# Patient Record
Sex: Female | Born: 1985 | Race: White | Hispanic: No | Marital: Single
Health system: Southern US, Community
[De-identification: ages and names within clinical notes are randomized; demographics above are authoritative.]

---

## 2016-10-09 ENCOUNTER — Emergency Department (HOSPITAL_COMMUNITY)
Admission: EM | Admit: 2016-10-09 | Discharge: 2016-10-09 | Disposition: A | Discharge status: Home / Self Care | Payer: Self-pay | Attending: Emergency Medicine | Admitting: Emergency Medicine

## 2016-10-09 DIAGNOSIS — F1910 Other psychoactive substance abuse, uncomplicated: Secondary | ICD-10-CM

## 2016-10-09 DIAGNOSIS — F1110 Opioid abuse, uncomplicated: Secondary | ICD-10-CM | POA: Insufficient documentation

## 2016-10-09 NOTE — ED Provider Notes (Signed)
[facility]-EM[facility] DEPT Provider Note   CSN: [id number] Arrival date & time: 10/09/16  0001  By signing my name below, I, Alyssa GroveMartin Green, attest that this documentation has been prepared under the direction and in the presence of Shon Batonourtney F Horton, MD. Electronically Signed: Alyssa GroveMartin Green, ED Scribe. 10/09/16. 1:01 AM.   History   Chief Complaint Chief Complaint  Patient presents with  . Psychiatric Evaluation   The history is provided by the patient. No language interpreter was used.   HPI Comments: Melinda Velez is a 30 y.o. female who presents to the Emergency Department presenting for medical clearance. Pt states she has been speaking with the officer and has changed her mind about detox. Denies drug use to me But endorsed multiple drugs to the triage nurse. She notes she slept at a friend's house last night. Denies hx of psychiatric disorder. No HI, SI, hallucinations.  Police officer at the bedside. Reports that he got called to the hotel. Reports that the patient wanted to speak to someone regarding "events." She was very vague. She endorsed wanting detox and was brought here. She is not under arrest.  No past medical history on file.  There are no active problems to display for this patient.   No past surgical history on file.  OB History    No data available      Home Medications    Prior to Admission medications   Not on File    Family History No family history on file.  Social History Social History  Substance Use Topics  . Smoking status: Not on file  . Smokeless tobacco: Not on file  . Alcohol use Not on file     Allergies   Review of patient's allergies indicates not on file.   Review of Systems Review of Systems  Constitutional: Negative for fever.  Respiratory: Negative for shortness of breath.   Cardiovascular: Negative for chest pain.  Psychiatric/Behavioral: Negative for hallucinations and suicidal ideas.       - Homicidal ideations  All  other systems reviewed and are negative.    Physical Exam Updated Vital Signs BP 111/87 (BP Location: Left Arm)   Pulse 72   Resp 20   Ht 5\' 6"  (1.676 m)   Wt 110 lb (49.9 kg)   SpO2 99%   BMI 17.75 kg/m   Physical Exam  Constitutional: She is oriented to person, place, and time. No distress.  Paranoid and anxious appearing, no acute distress  HENT:  Head: Normocephalic and atraumatic.  Cardiovascular: Normal rate and regular rhythm.   Pulmonary/Chest: Effort normal. No respiratory distress.  Neurological: She is alert and oriented to person, place, and time.  Skin: Skin is warm and dry.  Psychiatric:  Paranoid, does not appear to be responding to internal stimuli  Nursing note and vitals reviewed.    ED Treatments / Results  DIAGNOSTIC STUDIES: Oxygen Saturation is 99% on RA, normal by my interpretation.    Labs (all labs ordered are listed, but only abnormal results are displayed) Labs Reviewed - No data to display  EKG  EKG Interpretation None       Radiology No results found.  Procedures Procedures (including critical care time)  Medications Ordered in ED Medications - No data to display   Initial Impression / Assessment and Plan / ED Course  I have reviewed the triage vital signs and the nursing notes.  Pertinent labs & imaging results that were available during my care of the patient  were reviewed by me and considered in my medical decision making (see chart for details).  Clinical Course   Patient initially thought improper police as she was requesting detox. She currently denies drug use to me" has changed her mind." She appears paranoid and interesting. However, she is awake, alert, oriented 3. She denies SI or HI. Given that she is requesting discharge, do not feel she is a harm to herself at this time. She will be discharged at her own request.  I personally performed the services described in this documentation, which was scribed in my  presence. The recorded information has been reviewed and is accurate.   Final Clinical Impressions(s) / ED Diagnoses   Final diagnoses:  Substance abuse    New Prescriptions New Prescriptions   No medications on file     Shon Baton, MD 10/09/16 [phone]

## 2016-10-09 NOTE — ED Notes (Signed)
Bed: [facility] Expected date:  Expected time:  Means of arrival:  Comments: 

## 2016-10-09 NOTE — ED Triage Notes (Signed)
Patient was transported by [employer] but she is not in custody. Pt is wanting detox drugs, "everything", crack, meth, "pills", and morphine. Patient seems paranoid that she is going to get in trouble and police reported she wanted a safe place to go to. Pt reports she has had rehab at [facility] [place].

## 2016-10-09 NOTE — ED Notes (Signed)
Patient refused to complete triage questions. Dr. Wilkie AyeHorton came to bedside and spoke with patient. Patient is not admitting to taking any drugs or ETOH. Also, patient refused she was SI or HI.

## 2016-10-16 ENCOUNTER — Encounter (HOSPITAL_COMMUNITY): Payer: Self-pay

## 2016-10-16 ENCOUNTER — Emergency Department (HOSPITAL_COMMUNITY)
Admission: EM | Admit: 2016-10-16 | Discharge: 2016-10-16 | Disposition: A | Discharge status: Home / Self Care | Attending: Emergency Medicine | Admitting: Emergency Medicine

## 2016-10-16 ENCOUNTER — Emergency Department (HOSPITAL_COMMUNITY)

## 2016-10-16 DIAGNOSIS — S0101XA Laceration without foreign body of scalp, initial encounter: Secondary | ICD-10-CM | POA: Diagnosis present

## 2016-10-16 DIAGNOSIS — Y9302 Activity, running: Secondary | ICD-10-CM | POA: Diagnosis not present

## 2016-10-16 DIAGNOSIS — F17200 Nicotine dependence, unspecified, uncomplicated: Secondary | ICD-10-CM | POA: Insufficient documentation

## 2016-10-16 DIAGNOSIS — Y999 Unspecified external cause status: Secondary | ICD-10-CM | POA: Insufficient documentation

## 2016-10-16 DIAGNOSIS — W01198A Fall on same level from slipping, tripping and stumbling with subsequent striking against other object, initial encounter: Secondary | ICD-10-CM | POA: Insufficient documentation

## 2016-10-16 DIAGNOSIS — Y92149 Unspecified place in prison as the place of occurrence of the external cause: Secondary | ICD-10-CM | POA: Insufficient documentation

## 2016-10-16 DIAGNOSIS — Z23 Encounter for immunization: Secondary | ICD-10-CM | POA: Diagnosis not present

## 2016-10-16 MED ORDER — LIDOCAINE-EPINEPHRINE-TETRACAINE (LET) SOLUTION
3.0000 mL | Freq: Once | NASAL | Status: AC
  Administered 2016-10-16: 3 mL via TOPICAL
  Filled 2016-10-16: qty 3

## 2016-10-16 MED ORDER — TETANUS-DIPHTH-ACELL PERTUSSIS 5-2.5-18.5 LF-MCG/0.5 IM SUSP
0.5000 mL | Freq: Once | INTRAMUSCULAR | Status: AC
  Administered 2016-10-16: 0.5 mL via INTRAMUSCULAR
  Filled 2016-10-16: qty 0.5

## 2016-10-16 NOTE — ED Notes (Signed)
PA at bedside.

## 2016-10-16 NOTE — Discharge Instructions (Signed)
Have staples removed in one week Clean wound with soap and water

## 2016-10-16 NOTE — ED Triage Notes (Signed)
[facility]. Patient states she was mopping and someone left her a key to escape and states she let herself out of the cell and started running and slipped on the wet floor and fell back and hit the right parietal area of her head and right elbow-bleeding is now controlled. No LOC. Patient ambulatory on arrival.

## 2016-10-16 NOTE — ED Notes (Signed)
Head wrapped with gauze and coband per PA's orders

## 2016-10-16 NOTE — ED Provider Notes (Signed)
[facility] Provider Note   CSN: [id number] Arrival date & time: 10/16/16  2104  By signing my name below, I, Melinda Velez, attest that this documentation has been prepared under the direction and in the presence of Melinda HartKelly Conor Lata, PA-C. Electronically Signed: Linna Darnerussell Velez, Scribe. 10/16/2016. 10:00 PM.  History   Chief Complaint Chief Complaint  Patient presents with  . Fall  . Head Laceration    The history is provided by the patient. No language interpreter was used.     HPI Comments: Melinda Velez is a 30 y.o. female brought in by police who presents to the Emergency Department complaining of laceration to the right occipital area of her head s/p slipping and falling shortly PTA. Patient is a unreliable historian and it is unclear if the history is accurate. Pt is incarcerated and states she was trying to escape from jail. She states she was running, slipped on a wet floor, and hit the back of her head on the floor. She denies losing consciousness. She reports she had bleeding from her head which is now controlled. Pt states she also struck her right elbow on the floor and notes some right elbow pain and associated bruising. She states she thinks it's broken. Pt denies vision changes, left elbow pain, numbness, weakness, nausea, vomiting, or any other associated symptoms.  The officer with the patient states that the incident was unwitnessed but reports patient doesn't want to be in jail so she will intentionally injure herself.  History reviewed. No pertinent past medical history.  There are no active problems to display for this patient.   History reviewed. No pertinent surgical history.  OB History    No data available       Home Medications    Prior to Admission medications   Not on File    Family History No family history on file.  Social History Social History  Substance Use Topics  . Smoking status: Current Every Day Smoker  . Smokeless  tobacco: Never Used  . Alcohol use No     Allergies   Review of patient's allergies indicates no known allergies.   Review of Systems Review of Systems  Gastrointestinal: Negative for nausea and vomiting.  Musculoskeletal: Positive for arthralgias (right elbow).  Skin: Positive for color change (bruising to right elbow) and wound (head laceration).  Neurological: Negative for syncope, weakness and numbness.  All other systems reviewed and are negative.   Physical Exam Updated Vital Signs BP 115/70   Pulse 85   Temp 98.3 F (36.8 C)   Resp 20   SpO2 100%   Physical Exam  Constitutional: She is oriented to person, place, and time. She appears well-developed and well-nourished. No distress.  In handcuffs  HENT:  Head: Normocephalic.  3 cm posterior scalp laceration. Bleeding controlled  Eyes: Conjunctivae and EOM are normal.  Neck: Neck supple. No tracheal deviation present.  Cardiovascular: Normal rate.   Pulmonary/Chest: Effort normal. No respiratory distress.  Musculoskeletal: Normal range of motion.  Right elbow: No obvious swelling or deformity. Mild tenderness to palpation. ROM deferred (patient in hand cuffs). N/V intact.   Neurological: She is alert and oriented to person, place, and time.  Skin: Skin is warm and dry.  Psychiatric: She has a normal mood and affect. Her speech is normal. Thought content is delusional. Cognition and memory are impaired. She expresses impulsivity. She exhibits abnormal recent memory.  Patient states someone gave her a key to escape jail. Also stating  that 15 people have escaped and there are only 2 other people now in the jail  Nursing note and vitals reviewed.   ED Treatments / Results  Labs (all labs ordered are listed, but only abnormal results are displayed) Labs Reviewed - No data to display  EKG  EKG Interpretation None       Radiology No results found.  Procedures Procedures (including critical care  time)  LACERATION REPAIR Performed by: Melinda Velez Authorized by: Melinda Velez Consent: Verbal consent obtained. Risks and benefits: risks, benefits and alternatives were discussed Consent given by: patient Patient identity confirmed: provided demographic data Prepped and Draped in normal sterile fashion Wound explored  Laceration Location: posterior scalp  Laceration Length: 4 cm  No Foreign Bodies seen or palpated  Anesthesia: local anesthesia  Local anesthetic: LET  Anesthetic total: 3mL  Irrigation method: syringe Amount of cleaning: standard  Skin closure: Staples  Number of staples: 2  Patient tolerance: Patient tolerated the procedure well with no immediate complications.   DIAGNOSTIC STUDIES: Oxygen Saturation is 100% on RA, normal by my interpretation.    COORDINATION OF CARE: 10:10 PM Discussed treatment plan with pt at bedside and pt agreed to plan.  Medications Ordered in ED Medications  lidocaine-EPINEPHrine-tetracaine (LET) solution (3 mLs Topical Given 10/16/16 2126)  Tdap (BOOSTRIX) injection 0.5 mL (0.5 mLs Intramuscular Given 10/16/16 2126)     Initial Impression / Assessment and Plan / ED Course  I have reviewed the triage vital signs and the nursing notes.  Pertinent labs & imaging results that were available during my care of the patient were reviewed by me and considered in my medical decision making (see chart for details).  Clinical Course   30 year old female who presents with a head laceration after a fall and R elbow pain. 2 staples placed. Tdap updated.  Patient is delusional and paranoid but calm and cooperative. R elbow xray is negative. Will d/c back to jail.  I personally performed the services described in this documentation, which was scribed in my presence. The recorded information has been reviewed and is accurate.   Final Clinical Impressions(s) / ED Diagnoses   Final diagnoses:  Laceration of scalp without  foreign body, initial encounter    New Prescriptions New Prescriptions   No medications on file     Melinda Born, PA-C 10/17/16 1451    Melinda Core, MD 10/17/16 [phone]

## 2016-10-17 ENCOUNTER — Emergency Department (HOSPITAL_COMMUNITY)
Admission: EM | Admit: 2016-10-17 | Discharge: 2016-10-18 | Disposition: A | Discharge status: Home / Self Care | Attending: Emergency Medicine | Admitting: Emergency Medicine

## 2016-10-17 ENCOUNTER — Emergency Department (HOSPITAL_COMMUNITY)
Admission: EM | Admit: 2016-10-17 | Discharge: 2016-10-17 | Disposition: A | Discharge status: Home / Self Care | Source: Home / Self Care | Attending: Emergency Medicine | Admitting: Emergency Medicine

## 2016-10-17 ENCOUNTER — Encounter (HOSPITAL_COMMUNITY): Payer: Self-pay

## 2016-10-17 DIAGNOSIS — IMO0002 Reserved for concepts with insufficient information to code with codable children: Secondary | ICD-10-CM

## 2016-10-17 DIAGNOSIS — Y999 Unspecified external cause status: Secondary | ICD-10-CM

## 2016-10-17 DIAGNOSIS — Y939 Activity, unspecified: Secondary | ICD-10-CM | POA: Insufficient documentation

## 2016-10-17 DIAGNOSIS — F17200 Nicotine dependence, unspecified, uncomplicated: Secondary | ICD-10-CM | POA: Insufficient documentation

## 2016-10-17 DIAGNOSIS — S0990XA Unspecified injury of head, initial encounter: Secondary | ICD-10-CM

## 2016-10-17 DIAGNOSIS — X788XXA Intentional self-harm by other sharp object, initial encounter: Secondary | ICD-10-CM | POA: Insufficient documentation

## 2016-10-17 DIAGNOSIS — F6089 Other specific personality disorders: Secondary | ICD-10-CM | POA: Diagnosis not present

## 2016-10-17 DIAGNOSIS — F17210 Nicotine dependence, cigarettes, uncomplicated: Secondary | ICD-10-CM | POA: Diagnosis not present

## 2016-10-17 DIAGNOSIS — Z79899 Other long term (current) drug therapy: Secondary | ICD-10-CM | POA: Insufficient documentation

## 2016-10-17 DIAGNOSIS — Y92149 Unspecified place in prison as the place of occurrence of the external cause: Secondary | ICD-10-CM | POA: Insufficient documentation

## 2016-10-17 DIAGNOSIS — S0181XA Laceration without foreign body of other part of head, initial encounter: Secondary | ICD-10-CM

## 2016-10-17 DIAGNOSIS — F23 Brief psychotic disorder: Secondary | ICD-10-CM | POA: Diagnosis not present

## 2016-10-17 DIAGNOSIS — R4689 Other symptoms and signs involving appearance and behavior: Secondary | ICD-10-CM

## 2016-10-17 DIAGNOSIS — Z046 Encounter for general psychiatric examination, requested by authority: Secondary | ICD-10-CM | POA: Diagnosis present

## 2016-10-17 DIAGNOSIS — Z7289 Other problems related to lifestyle: Secondary | ICD-10-CM

## 2016-10-17 LAB — RAPID URINE DRUG SCREEN, HOSP PERFORMED
AMPHETAMINES: NOT DETECTED
BARBITURATES: NOT DETECTED
Benzodiazepines: NOT DETECTED
COCAINE: NOT DETECTED
OPIATES: NOT DETECTED
Tetrahydrocannabinol: NOT DETECTED

## 2016-10-17 LAB — COMPREHENSIVE METABOLIC PANEL
ALT: 13 U/L — ABNORMAL LOW (ref 14–54)
AST: 18 U/L (ref 15–41)
Albumin: 3.9 g/dL (ref 3.5–5.0)
Alkaline Phosphatase: 60 U/L (ref 38–126)
Anion gap: 8 (ref 5–15)
BILIRUBIN TOTAL: 1 mg/dL (ref 0.3–1.2)
CO2: 26 mmol/L (ref 22–32)
CREATININE: 0.68 mg/dL (ref 0.44–1.00)
Calcium: 8.9 mg/dL (ref 8.9–10.3)
Chloride: 101 mmol/L (ref 101–111)
Glucose, Bld: 86 mg/dL (ref 65–99)
POTASSIUM: 3.4 mmol/L — AB (ref 3.5–5.1)
Sodium: 135 mmol/L (ref 135–145)
TOTAL PROTEIN: 6.8 g/dL (ref 6.5–8.1)

## 2016-10-17 LAB — ETHANOL: Alcohol, Ethyl (B): <5 mg/dL (ref <5)

## 2016-10-17 LAB — CBC WITH DIFFERENTIAL/PLATELET
BASOS PCT: 0 %
Basophils Absolute: 0 10*3/uL (ref 0.0–0.1)
Eosinophils Absolute: 0.1 10*3/uL (ref 0.0–0.7)
Eosinophils Relative: 1 %
HEMATOCRIT: 36.8 % (ref 36.0–46.0)
Hemoglobin: 12.2 g/dL (ref 12.0–15.0)
LYMPHS ABS: 1.6 10*3/uL (ref 0.7–4.0)
Lymphocytes Relative: 20 %
MCH: 30.4 pg (ref 26.0–34.0)
MCHC: 33.2 g/dL (ref 30.0–36.0)
MCV: 91.8 fL (ref 78.0–100.0)
MONOS PCT: 10 %
Monocytes Absolute: 0.8 10*3/uL (ref 0.1–1.0)
NEUTROS ABS: 5.6 10*3/uL (ref 1.7–7.7)
NEUTROS PCT: 69 %
Platelets: 191 10*3/uL (ref 150–400)
RBC: 4.01 MIL/uL (ref 3.87–5.11)
RDW: 15.2 % (ref 11.5–15.5)
WBC: 8.1 10*3/uL (ref 4.0–10.5)

## 2016-10-17 LAB — I-STAT BETA HCG BLOOD, ED (MC, WL, AP ONLY): I-stat hCG, quantitative: <5 m[IU]/mL (ref <5)

## 2016-10-17 MED ORDER — LIDOCAINE-EPINEPHRINE (PF) 1 %-1:200000 IJ SOLN
20.0000 mL | Freq: Once | INTRAMUSCULAR | Status: AC
  Administered 2016-10-17: 20 mL
  Filled 2016-10-17: qty 30

## 2016-10-17 MED ORDER — LORAZEPAM 1 MG PO TABS
1.0000 mg | ORAL_TABLET | Freq: Three times a day (TID) | ORAL | Status: DC | PRN
  Administered 2016-10-17: 1 mg via ORAL
  Filled 2016-10-17: qty 1

## 2016-10-17 MED ORDER — LIDOCAINE-EPINEPHRINE-TETRACAINE (LET) SOLUTION: 3.0000 mL | Freq: Once | NASAL | Status: DC

## 2016-10-17 NOTE — ED Provider Notes (Signed)
Pt seen and evaluated by Dr Melinda Velez.  Please see her chart for further detail.  I was asked to assist by performing laceration repair.   2:42 PM Pt assessed, laceration repair equipment set up.  Pt became very paranoid, [facility]  She requested not to have any injection or anesthesia.  Will order LET.    3:34 PM Pt very agitated, but allowed me to suture her forehead.  She did accept some injected lidocaine.     LACERATION REPAIR Performed by: Melinda DredgeWEST, Melinda Velez Authorized by: Melinda DredgeWEST, Melinda Velez Consent: Verbal consent obtained. Risks and benefits: risks, benefits and alternatives were discussed Consent given by: patient Patient identity confirmed: provided demographic data Prepped and Draped in normal sterile fashion Wound explored  Laceration Location: forehead  Laceration Length: See Dr Theodora BlowLiu's note for specific detail. Approximately 7 cm  No Foreign Bodies seen or palpated  Anesthesia: local infiltration  Local anesthetic: lidocaine 1% with epinephrine  Anesthetic total: 3 ml  Irrigation method: syringe Amount of cleaning: standard  Skin closure: 5-0 vicryl rapide  Number of sutures: 7  Technique: simple interrupted   Patient tolerance: Patient tolerated the procedure well with no immediate complications.    Melinda Dredgemily Nai Borromeo, PA-C 10/17/16 1536    Melinda Guiseana Duo Liu, MD 10/17/16 [phone]

## 2016-10-17 NOTE — ED Notes (Signed)
PA at bedside. Will update vitals after

## 2016-10-17 NOTE — ED Notes (Signed)
Pt states that she feels unsafe here. States that she feels as if one of the officers who were here earlier with her may be trying to influence the decisions of the physicians. Explained to pt that officers are no longer involved in her care. Pt still seems paranoid and anxious.

## 2016-10-17 NOTE — ED Notes (Signed)
Per Doctor [name], hold off on lab work until after TTS sees patient.

## 2016-10-17 NOTE — Progress Notes (Signed)
Pt stated she used a comb to cut her forehead. She stated, "if I have to go back to that jail I will kill myself. I am not going to be used as a sex toy." Pts speech is very rapid . She denies a mental health hx and was treated last pm for a laceration to the back of her head trying to escape the jail.

## 2016-10-17 NOTE — ED Notes (Signed)
Pt refused to have discharge vitals taken.

## 2016-10-17 NOTE — ED Provider Notes (Signed)
[facility] Provider Note   CSN: [id number] Arrival date & time: 10/17/16  1155     History   Chief Complaint Chief Complaint  Patient presents with  . Head Injury    HPI Melinda Velez is a 30 y.o. female.  HPI  30 year old female who presents with self-inflicted wound. From [facility] facility. Seen in ED yesterday for for head injury with laceration to the scalp, which she states she incurred while trying to escape from jail. Discharged back to facility. Patient today took sharp end of comb and made self inflicted laceration to the forehead.   She states that she did to not to hurt herself but to try to get out of prison. She states she wants to leave prison because everyone else as escaped and she is the only one left. States that the guards have been leaving as well, and she is only stuck with guards who think she is bait. She states that she recently has been killed as well. Denies homicidal thoughts or auditory/visual hallucinations.   Denies any injuries.   History reviewed. No pertinent past medical history.  There are no active problems to display for this patient.   History reviewed. No pertinent surgical history.  OB History    No data available       Home Medications    Prior to Admission medications   Medication Sig Start Date End Date Taking? Authorizing Provider  benztropine (COGENTIN) 1 MG tablet Take 1 mg by mouth 2 (two) times daily as needed for tremors.    Historical Provider, MD  divalproex (DEPAKOTE ER) 250 MG 24 hr tablet Take 750 mg by mouth daily.    Historical Provider, MD  risperiDONE (RISPERDAL) 3 MG tablet Take 3 mg by mouth 2 (two) times daily.    Historical Provider, MD    Family History No family history on file.  Social History Social History  Substance Use Topics  . Smoking status: Current Every Day Smoker  . Smokeless tobacco: Never Used  . Alcohol use No     Allergies   Review of patient's  allergies indicates no known allergies.   Review of Systems Review of Systems 10/14 systems reviewed and are negative other than those stated in the HPI   Physical Exam Updated Vital Signs BP 103/72 (BP Location: Left Arm)   Pulse 85   Temp 97.9 F (36.6 C) (Oral)   Resp 20   Ht 5\' 4"  (1.626 m)   Wt 100 lb (45.4 kg)   LMP  (LMP Unknown)   SpO2 100%   BMI 17.16 kg/m   Physical Exam Physical Exam  Nursing note and vitals reviewed. Constitutional: Well developed, well nourished, non-toxic, and in no acute distress Head: Normocephalic. 6 cm verticle laceration to the middle of the forehead Mouth/Throat: Oropharynx is clear and moist.  Neck: Normal range of motion. Neck supple.  Cardiovascular: Normal rate and regular rhythm.   Pulmonary/Chest: Effort normal and breath sounds normal.  Abdominal: Soft. There is no tenderness. There is no rebound and no guarding.  Musculoskeletal: Normal range of motion.  Neurological: Alert, no facial droop, fluent speech, moves all extremities symmetrically Skin: Skin is warm and dry.  Psychiatric: Cooperative, flight of ideas, rapid pressured speech   ED Treatments / Results  Labs (all labs ordered are listed, but only abnormal results are displayed) Labs Reviewed - No data to display  EKG  EKG Interpretation None  Radiology Dg Elbow Complete Right  Result Date: 10/16/2016 CLINICAL DATA:  30 year old female with right elbow pain. EXAM: RIGHT ELBOW - COMPLETE 3+ VIEW COMPARISON:  None. FINDINGS: There is no evidence of fracture, dislocation, or joint effusion. There is no evidence of arthropathy or other focal bone abnormality. Soft tissues are unremarkable. IMPRESSION: Negative. Electronically Signed   By: Elgie CollardArash  Radparvar M.D.   On: 10/16/2016 23:01    Procedures Procedures (including critical care time)  Medications Ordered in ED Medications  lidocaine-EPINEPHrine (XYLOCAINE-EPINEPHrine) 1 %-1:200000 (PF) injection 20  mL (20 mLs Infiltration Given 10/17/16 1616)     Initial Impression / Assessment and Plan / ED Course  I have reviewed the triage vital signs and the nursing notes.  Pertinent labs & imaging results that were available during my care of the patient were reviewed by me and considered in my medical decision making (see chart for details).  Clinical Course    Presents with self inflicted wound of forehead. Sutured at bedside (please see separate documentation). No other injuries noted. Has rapid pressured speech with ? Of paranoid delusions. She is refusing medical clearance blood work and urine. Discussed with TTS. Because she is correctional facility patient, unable to stay here as psych hold for formal psychiatric evaluation. Discussed with Onalee Huaavid from TTS. Plan for patient to be discharged so that she can be released from correctional facility on unsecured bond. [employer] will IVC her upon release to bring her to ED for formal psychiatric evaluation and medical clearance.  Final Clinical Impressions(s) / ED Diagnoses   Final diagnoses:  Injury of head, initial encounter  Forehead laceration, initial encounter    New Prescriptions Discharge Medication List as of 10/17/2016  4:22 PM       Lavera Guiseana Duo Landen Breeland, MD 10/17/16 1710

## 2016-10-17 NOTE — ED Notes (Signed)
PT is upset request for door to stay open info PT staff close door due to loud scream. PT continue to scream into the hall at staff.

## 2016-10-17 NOTE — ED Provider Notes (Signed)
[facility] DEPT Provider Note   CSN: [id number] Arrival date & time: 10/17/16  [id number]     History   Chief Complaint Chief Complaint  Patient presents with  . Medical Clearance    HPI Melinda Velez is a 30 y.o. female.  Patient with unknown medical history presents with police under IVC paperwork. Patient was in jail for trespassing and during her stay overnight patient tried to cut herself numerous times and repeatedly hit herself in the head and hit her head on objects. Patient continued to repeat that she and others were trying to escape and she has people waiting for her. Patient denies psychiatric history however patient does have psychiatric meds and home med list. Patient will not provide details except that she wants to leave.      No past medical history on file.  There are no active problems to display for this patient.   No past surgical history on file.  OB History    No data available       Home Medications    Prior to Admission medications   Not on File    Family History No family history on file.  Social History Social History  Substance Use Topics  . Smoking status: Current Every Day Smoker  . Smokeless tobacco: Never Used  . Alcohol use No     Allergies   Review of patient's allergies indicates no known allergies.   Review of Systems Review of Systems   Physical Exam Updated Vital Signs BP 98/83   Pulse 87   Temp 98.3 F (36.8 C) (Oral)   Resp 18   LMP  (LMP Unknown)   SpO2 100%   Physical Exam  Constitutional: She is oriented to person, place, and time. She appears well-developed.  HENT:  Head: Normocephalic.  Eyes: Right eye exhibits no discharge. Left eye exhibits no discharge.  Neck: Normal range of motion. Neck supple. No tracheal deviation present.  Cardiovascular: Normal rate and regular rhythm.   Pulmonary/Chest: Effort normal.  Abdominal: Soft. She exhibits no distension. There is no tenderness. There is no  guarding.  Musculoskeletal: She exhibits no edema.  Neurological: She is alert and oriented to person, place, and time.  Patient has normal muscle tone in all extremities, equal strength 5+ upper and lower extremities grossly the testing. No nystagmus, pupils 4 mm responsive bilateral. Extraocular muscle function intact.  Skin: Skin is warm. No rash noted.  Patient has partially healing linear vertical laceration that was repaired recently. No active bleeding to the 4 head.  Psychiatric: She expresses no suicidal plans and no homicidal plans.  Patient alert, mild agitation, mild repetition and discussion. Patient continues to state she needs to escape and she has people waiting for her.  Nursing note and vitals reviewed.    ED Treatments / Results  Labs (all labs ordered are listed, but only abnormal results are displayed) Labs Reviewed  CBC WITH DIFFERENTIAL/PLATELET  RAPID URINE DRUG SCREEN, HOSP PERFORMED  COMPREHENSIVE METABOLIC PANEL  ETHANOL  I-STAT BETA HCG BLOOD, ED ([facility], [facility], [facility] ONLY)    EKG  EKG Interpretation None       Radiology Dg Elbow Complete Right  Result Date: 10/16/2016 CLINICAL DATA:  30 year old female with right elbow pain. EXAM: RIGHT ELBOW - COMPLETE 3+ VIEW COMPARISON:  None. FINDINGS: There is no evidence of fracture, dislocation, or joint effusion. There is no evidence of arthropathy or other focal bone abnormality. Soft tissues are unremarkable. IMPRESSION: Negative. Electronically Signed  By: Elgie Collard M.D.   On: 10/16/2016 23:01    Procedures Procedures (including critical care time)  Medications Ordered in ED Medications  LORazepam (ATIVAN) tablet 1 mg (not administered)     Initial Impression / Assessment and Plan / ED Course  I have reviewed the triage vital signs and the nursing notes.  Pertinent labs & imaging results that were available during my care of the patient were reviewed by me and considered in my medical decision  making (see chart for details).  Clinical Course   Patient presents with police with concern for acute psychosis. Patient was seen yesterday after she hit her head multiple times causing significant laceration requiring repair. Patient clinically has acute psychosis with recent head injury, recent CT head negative no bleeding, patient likely also has a concussion. Patient needed be cleared by psychiatry. Police at bedside.  Psychiatry recommends reassessment in the morning. Patient comfortable on reassessment. Differential diagnosis were considered with the presenting HPI.  Medications  LORazepam (ATIVAN) tablet 1 mg (not administered)    Vitals:   10/17/16 1830  BP: 98/83  Pulse: 87  Resp: 18  Temp: 98.3 F (36.8 C)  TempSrc: Oral  SpO2: 100%    Final diagnoses:  Acute psychosis  Self-inflicted injury      Final Clinical Impressions(s) / ED Diagnoses   Final diagnoses:  Acute psychosis  Self-inflicted injury    New Prescriptions New Prescriptions   No medications on file     Blane Ohara, MD 10/17/16 2153

## 2016-10-17 NOTE — ED Notes (Addendum)
MD [name] info me to hold off on orders till PT is seen by TTS

## 2016-10-17 NOTE — ED Notes (Signed)
Pt refused CT scan. States she felt like she was choking while laying flat. Pt also states that [facility] can [facility] and that her head is fine.

## 2016-10-17 NOTE — ED Notes (Signed)
Pt verbalized understanding of discharge instructions.  Discharged into police custody.

## 2016-10-17 NOTE — BH Assessment (Signed)
Assessment Note  Melinda Velez is an 30 y.o. female that presents this date in custody by [employer]. Patient has a self inflicted wound on her forehead stating she wanted to "get out of jail." As this writer attempted to conduct assessment patient stated "I am not going to do anything here, just take me back to jail." Patient would not elaborate on why she refused assessment. Patient refused to answer any questions as collateral information was obtained from sheriff's department deputies who were present. Deputies stated patient cut her forehead as an attempt to get out of jail. Patient denied any S/I to this writer stating "no, no no I just hate that jail, but your not going to get my blood or put me on drugs. I can just go back to jail." Patient denies any H/I or AVH. Melinda FearsLui  MD spoke with this Clinical research associatewriter and agreed to release patient back into police custody. Patient was unable to be assessed and declined to answer any questions on assessment. Case was staffed with Melinda Velez who recommended patient be released back into police custody.   Diagnosis: Deferred   Past Medical History: History reviewed. No pertinent past medical history.  History reviewed. No pertinent surgical history.  Family History: No family history on file.  Social History:  reports that she has been smoking.  She has never used smokeless tobacco. She reports that she does not drink alcohol. Her drug history is not on file.  Additional Social History:  Alcohol / Drug Use Pain Medications: See MAR Prescriptions: See MAR Over the Counter: See MAR History of alcohol / drug use?: No history of alcohol / drug abuse  CIWA: CIWA-Ar BP: 103/72 Pulse Rate: 85 COWS:    Allergies: No Known Allergies  Home Medications:  (Not in a hospital admission)  OB/GYN Status:  No LMP recorded (lmp unknown).  General Assessment Data Location of Assessment: [facility] ED TTS Assessment: In system Is this a Tele or Face-to-Face Assessment?:  Face-to-Face Is this an Initial Assessment or a Re-assessment for this encounter?: Initial Assessment Marital status:  Rich Reining(UTA ) Maiden name:  (UTA) Is patient pregnant?: Unknown Pregnancy Status: Unknown Living Arrangements:  (UTA) Can pt return to current living arrangement?:  (UTA) Admission Status: Voluntary Is patient capable of signing voluntary admission?: Yes Referral Source: Other Mudlogger(law enforcement) Insurance type:  Industrial/product designer(UTA)  Medical Screening Exam [facility]) Medical Exam completed: Yes  Crisis Care Plan Living Arrangements:  (UTA) Legal Guardian:  (UTA) Name of Psychiatrist:  (UTA) Name of Therapist:  (UTA)  Education Status Is patient currently in school?:  (UTA) Current Grade:  (UTA) Highest grade of school patient has completed:  (UTA) Name of school:  (UTA) Contact person:  (UTA)  Risk to self with the past 6 months Suicidal Ideation: No (pt denied ) Has patient been a risk to self within the past 6 months prior to admission? :  (UTA) Suicidal Intent: No (pt denied) Has patient had any suicidal intent within the past 6 months prior to admission? :  (UTA) Is patient at risk for suicide?: No (pt denies) Suicidal Plan?: No Has patient had any suicidal plan within the past 6 months prior to admission? :  (UTA) Access to Means:  (UTA) What has been your use of drugs/alcohol within the last 12 months?:  (UTA) Previous Attempts/Gestures:  (UTA) How many times?:  (UTA) Other Self Harm Risks:  (UTA) Triggers for Past Attempts:  (UTA) Intentional Self Injurious Behavior: Cutting (denies S/I pt cut  herself to get out of jail) Comment - Self Injurious Behavior: pt cut herself to get out of jail Family Suicide History:  (UTA) Recent stressful life event(s):  (UTA) Persecutory voices/beliefs?:  (UTA) Depression:  (UTA) Depression Symptoms:  (UTA) Substance abuse history and/or treatment for substance abuse?: No Suicide prevention information given to non-admitted  patients: Not applicable  Risk to Others within the past 6 months Homicidal Ideation:  (UTA) Does patient have any lifetime risk of violence toward others beyond the six months prior to admission? :  (UTA) Thoughts of Harm to Others:  (UTA) Current Homicidal Intent:  (UTA) Current Homicidal Plan:  (UTA) Access to Homicidal Means:  (UTA) Identified Victim:  (UTA) History of harm to others?:  (UTA) Assessment of Violence:  (UTA) Violent Behavior Description:  (UTA) Does patient have access to weapons?:  (UTA) Criminal Charges Pending?: Yes Describe Pending Criminal Charges:  (Unknown) Does patient have a court date:  (UTA) Is patient on probation?:  (UTA)  Psychosis Hallucinations:  (UTA) Delusions:  (UTA)  Mental Status Report Appearance/Hygiene: In scrubs Eye Contact: Fair Motor Activity: Agitation Speech: Argumentative Level of Consciousness: Alert Mood: Angry Affect: Anxious Anxiety Level: Moderate Thought Processes: Coherent, Relevant Judgement: Unimpaired Orientation: Person, Place, Time Obsessive Compulsive Thoughts/Behaviors: None  Cognitive Functioning Concentration: Normal Memory: Recent Intact, Remote Intact IQ: Average Insight: Poor Impulse Control: Unable to Assess Appetite:  (UTA) Weight Loss:  (UTA) Weight Gain:  (UTA) Sleep:  (UTA) Total Hours of Sleep:  (UTA) Vegetative Symptoms:  (UTA)  ADLScreening [facility] Assessment Services) Patient's cognitive ability adequate to safely complete daily activities?: Yes Patient able to express need for assistance with ADLs?: Yes Independently performs ADLs?: Yes (appropriate for developmental age)  Prior Inpatient Therapy Prior Inpatient Therapy:  (UTA) Prior Therapy Dates:  (UTA) Prior Therapy Facilty/Provider(s):  (UTA) Reason for Treatment:  (UTA)  Prior Outpatient Therapy Prior Outpatient Therapy:  (UTA) Prior Therapy Dates:  (UTA) Prior Therapy Facilty/Provider(s):  (UTA) Reason for Treatment:   (UTA) Does patient have an ACCT team?: Unknown Does patient have Intensive In-House Services?  : Unknown Does patient have [facility] services? : Unknown Does patient have P4CC services?: Unknown  ADL Screening (condition at time of admission) Patient's cognitive ability adequate to safely complete daily activities?: Yes Is the patient deaf or have difficulty hearing?: No Does the patient have difficulty seeing, even when wearing glasses/contacts?: No Does the patient have difficulty concentrating, remembering, or making decisions?: No Patient able to express need for assistance with ADLs?: Yes Does the patient have difficulty dressing or bathing?: No Independently performs ADLs?: Yes (appropriate for developmental age) Does the patient have difficulty walking or climbing stairs?: No Weakness of Legs: None Weakness of Arms/Hands: None     Therapy Consults (therapy consults require a physician order) PT Evaluation Needed: No OT Evalulation Needed: No SLP Evaluation Needed: No Abuse/Neglect Assessment (Assessment to be complete while patient is alone) Physical Abuse: Denies Verbal Abuse: Denies Sexual Abuse: Denies Exploitation of patient/patient's resources: Denies Self-Neglect: Denies Values / Beliefs Cultural Requests During Hospitalization: None Spiritual Requests During Hospitalization: None Consults Spiritual Care Consult Needed: No Social Work Consult Needed: No Merchant navy officer (For Healthcare) Does patient have an advance directive?: No Would patient like information on creating an advanced directive?: No - patient declined information    Additional Information 1:1 In Past 12 Months?:  (UTA) CIRT Risk:  (UTA) Elopement Risk:  (UTA) Does patient have medical clearance?: Yes     Disposition: Case was staffed with Melinda Pollack  Velez who recommended patient be released back into police custody. Disposition Initial Assessment Completed for this Encounter: Yes Disposition of  Patient: Other dispositions Other disposition(s):  (pt transported back to jail)  On Site Evaluation by:   Reviewed with Physician:    Alfredia Ferguson 10/17/2016 5:05 PM

## 2016-10-17 NOTE — ED Triage Notes (Addendum)
Pt c/o cutting her forehead today with a plastic piece she made at the jail from multiple items. Pt stated "I will do anything to get out of that place, even if that means I have to stay here [[facility]] forever"  Pt was seen here yesterday with a head injury, pt fell and received two staples in the back of her head. Pt is suicidal but denies HI. Pt denies substance abuse.

## 2016-10-17 NOTE — Discharge Instructions (Signed)
You have dissolvable sutures. They should come out on their own. Watch for signs of infection, including fever, pus drainage, increased redness/swelling, or any other symptoms concerning to you.

## 2016-10-17 NOTE — ED Triage Notes (Signed)
Pt presents with sheriff department under IVC. She reports she was released from jail today, but was told by jail staff that she must be medically cleared before she can go home. The pt has multiple self inflicted wounds that are in various stages of healing. Poor hygiene. She is very angry right now, somewhat uncooperative but can be calmed down and redirected at times. She continues to ask the sheriff to remove her handcuffs and let her go home. She allowed staff to check vitals but is not cooperative for blood work. She is alert but does not tell a clear story of why she thinks the jail wanted her to come here.
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
## 2016-10-17 NOTE — ED Notes (Signed)
Convinced patient to take ativan at this time.  Anxious and yelling out.  Encouraged to call for assistance as needed.

## 2016-10-18 DIAGNOSIS — F17210 Nicotine dependence, cigarettes, uncomplicated: Secondary | ICD-10-CM | POA: Diagnosis not present

## 2016-10-18 DIAGNOSIS — F6089 Other specific personality disorders: Secondary | ICD-10-CM

## 2016-10-18 DIAGNOSIS — R4689 Other symptoms and signs involving appearance and behavior: Secondary | ICD-10-CM

## 2016-10-18 NOTE — ED Notes (Signed)
Pt on phone at nurses' desk. 

## 2016-10-18 NOTE — ED Notes (Signed)
Telepsych being performed. 

## 2016-10-18 NOTE — ED Notes (Signed)
Pt noted to be manic - talkative.

## 2016-10-18 NOTE — ED Notes (Signed)
Original rescind paperwork placed in folder for [employer] Decker[employer]erk of Court, copy faxed to [employer]erk of Court, and copy sent to Medical Records.
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
## 2016-10-18 NOTE — ED Provider Notes (Signed)
Patient has been assessed by behavioral health counselor and does not meet criteria for inpatient treatment. The patient states  to me that she has no suicidal or homicidal ideation. She describes social problems regarding incarceration that precipitated her self injury. Patient is alert and nontoxic. Speech is clear. Movements are coordinated purposeful and symmetric. Discharge paperwork done and IVC rescinded per behavioral health assessment and recommendation.   Melinda BarretteMarcy Calirose Mccance, MD 10/18/16 [phone]

## 2016-10-18 NOTE — ED Notes (Signed)
Pt on phone at nurses' desk advising friend of pending d/c.

## 2017-03-23 ENCOUNTER — Encounter (HOSPITAL_COMMUNITY): Payer: Self-pay | Admitting: Emergency Medicine

## 2017-03-23 ENCOUNTER — Emergency Department (HOSPITAL_COMMUNITY)
Admission: EM | Admit: 2017-03-23 | Discharge: 2017-03-23 | Disposition: A | Discharge status: Home / Self Care | Attending: Emergency Medicine | Admitting: Emergency Medicine

## 2017-03-23 DIAGNOSIS — F17200 Nicotine dependence, unspecified, uncomplicated: Secondary | ICD-10-CM | POA: Insufficient documentation

## 2017-03-23 DIAGNOSIS — Z915 Personal history of self-harm: Secondary | ICD-10-CM | POA: Diagnosis not present

## 2017-03-23 DIAGNOSIS — T7621XA Adult sexual abuse, suspected, initial encounter: Secondary | ICD-10-CM | POA: Diagnosis present

## 2017-03-23 DIAGNOSIS — IMO0002 Reserved for concepts with insufficient information to code with codable children: Secondary | ICD-10-CM

## 2017-03-23 DIAGNOSIS — R45851 Suicidal ideations: Secondary | ICD-10-CM
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
## 2017-03-23 NOTE — ED Notes (Signed)
[facility]  Per report, pt can be released back to correctional facility  MD not available at this time.  tOLD TO TELL md TO READ NOTE.
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
## 2017-03-23 NOTE — SANE Note (Signed)
I contacted Matt, her nurse in the ED and explained my findings with her competency. Matt noted a TTS consult was pending. Susy FrizzleMatt was told her have her drip dry in the event she expressed a need to urinate. The day shift RN will check on the patient during the day to determine the course of action and reevaluate her competency.
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
## 2017-03-23 NOTE — ED Notes (Signed)
RN was in room with Cyndia SkeetersHannah M, PA-C for the EDP evaluation.

## 2017-03-23 NOTE — ED Notes (Signed)
Pt called me into room.  She is uncomfortable with female guard and is asking for female.  Explained she is in their custody, that I had no say over the officers.  Room window and curtain is open, she is in plain view of staff.  This was all explained.  Pt also asking for handcuffs to be moved.  Officer is aware of pt request.

## 2017-03-23 NOTE — ED Notes (Signed)
SANE RN Mardella LaymanLindsey returned call.  She is aware of pt desire to cancel her exam and will not consult further.
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
## 2017-03-23 NOTE — ED Notes (Signed)
TTS being performed at bedside 

## 2017-03-23 NOTE — ED Notes (Signed)
Pt stated she is refusing SANE exam.  Ready to be discharged and sent back to correctional facility.  Call out to SANE RN.

## 2017-03-23 NOTE — ED Notes (Signed)
Melissa RN SANE nurse spoke with RN and is in room talking with patient and performing exam

## 2017-05-10 IMAGING — CR DG ELBOW COMPLETE 3+V*R*
4 series · 4 of 4 positions shown · non-contrast
Comparison: None.

CLINICAL DATA: 30-year-old female with right elbow pain.

EXAM:
RIGHT ELBOW - COMPLETE 3+ VIEW

[x elbow ap right]
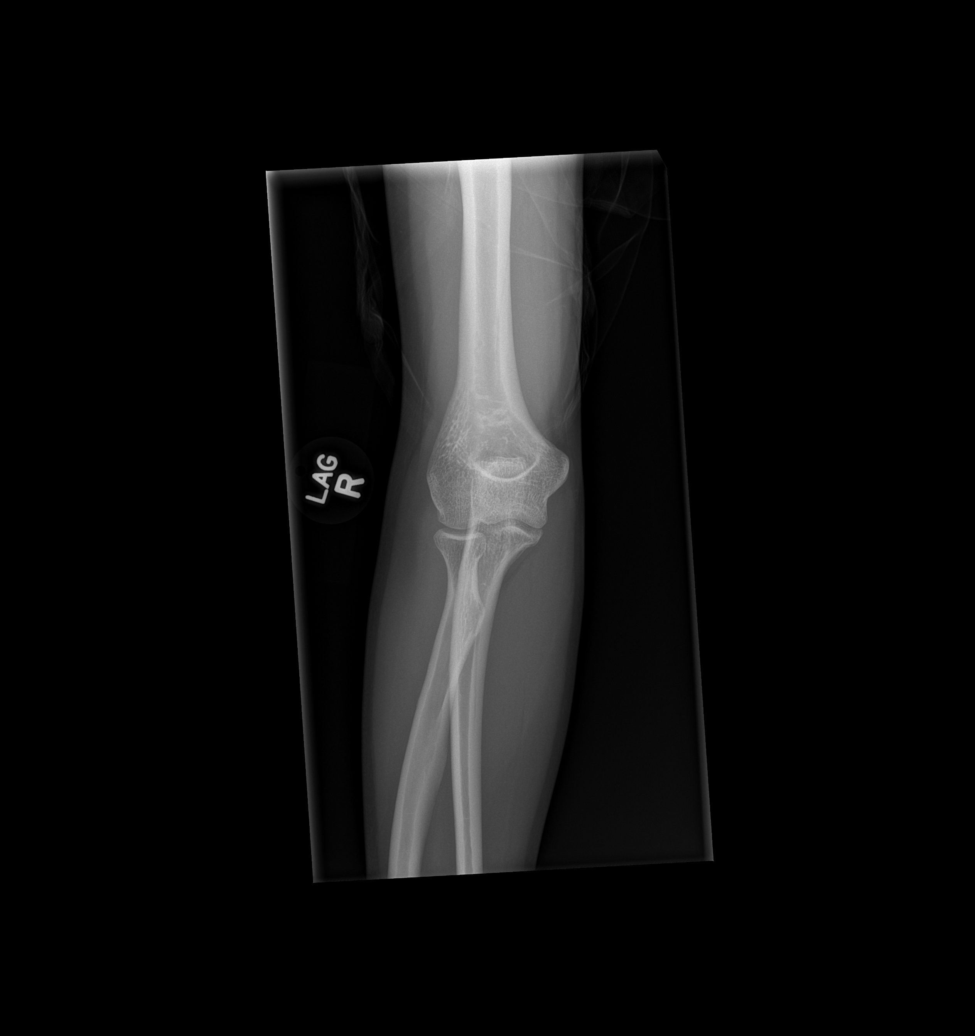

[x elbow obl right (1 of 2)]
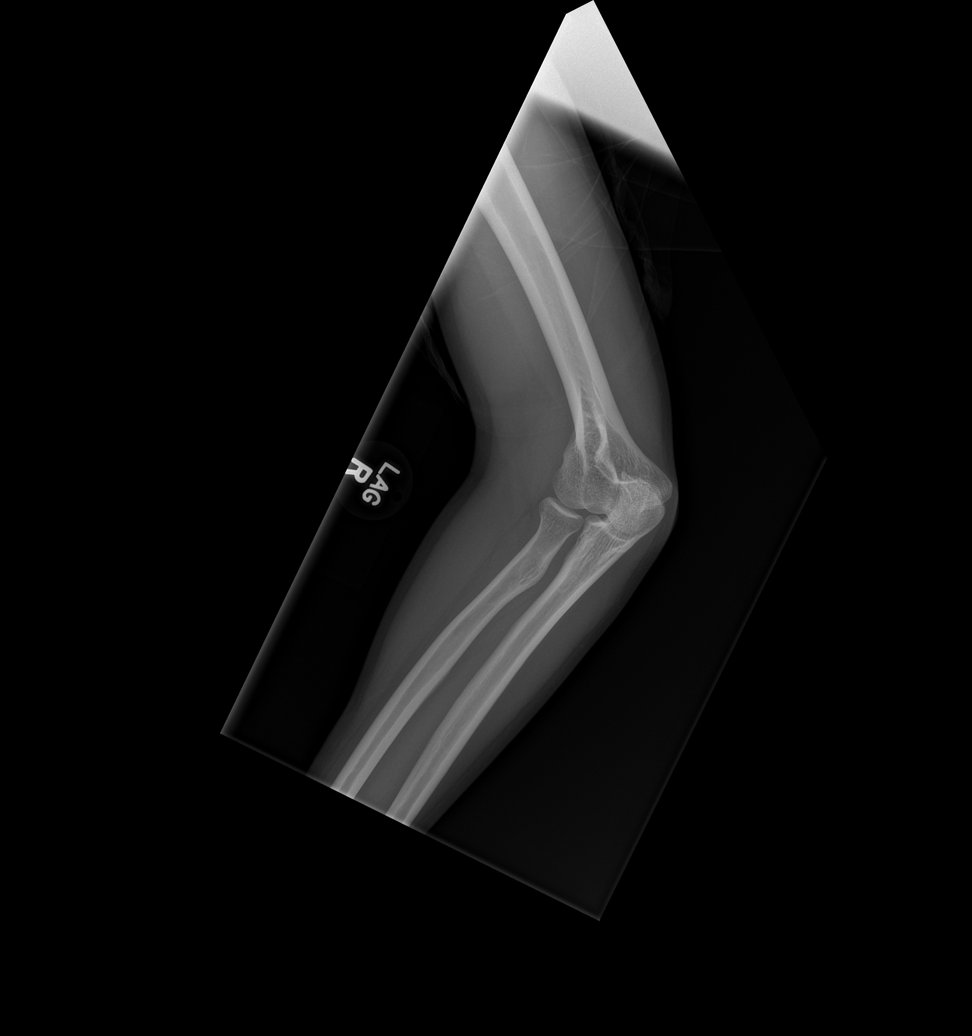

[x elbow obl right (2 of 2)]
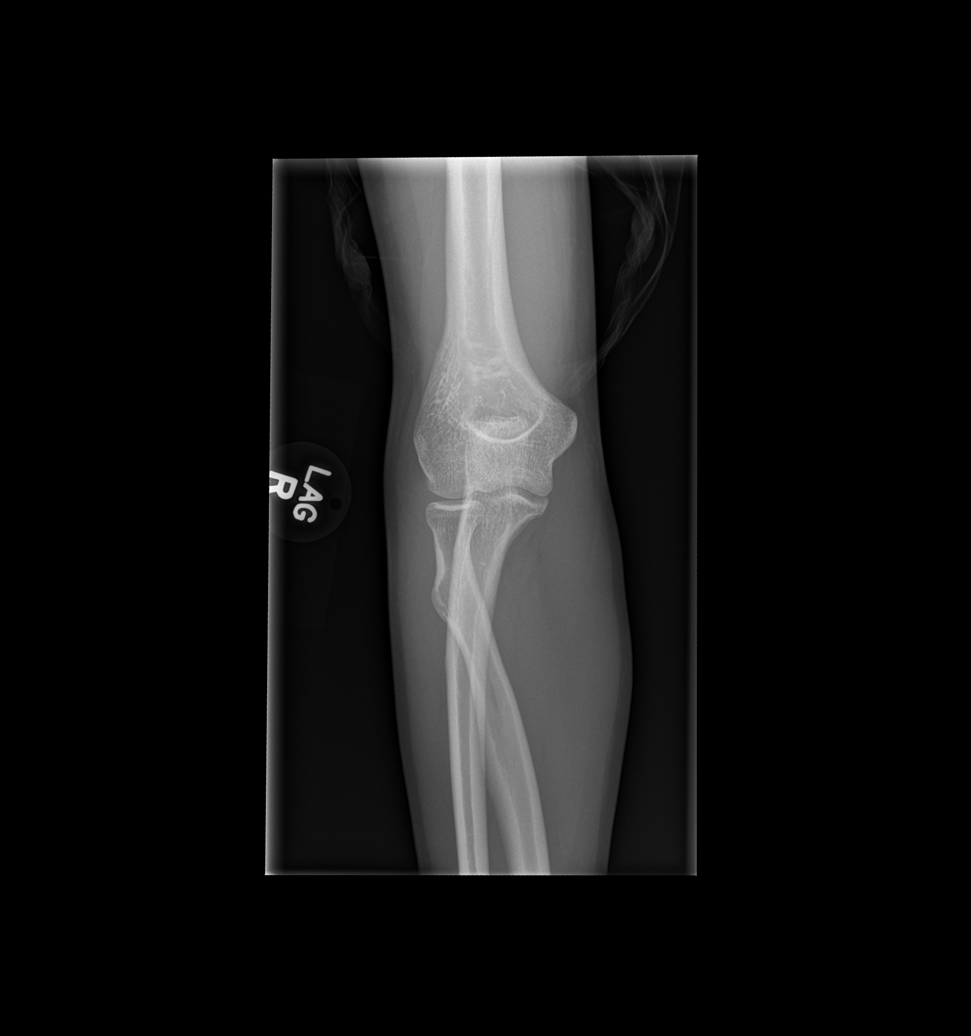

[x elbow lat right]
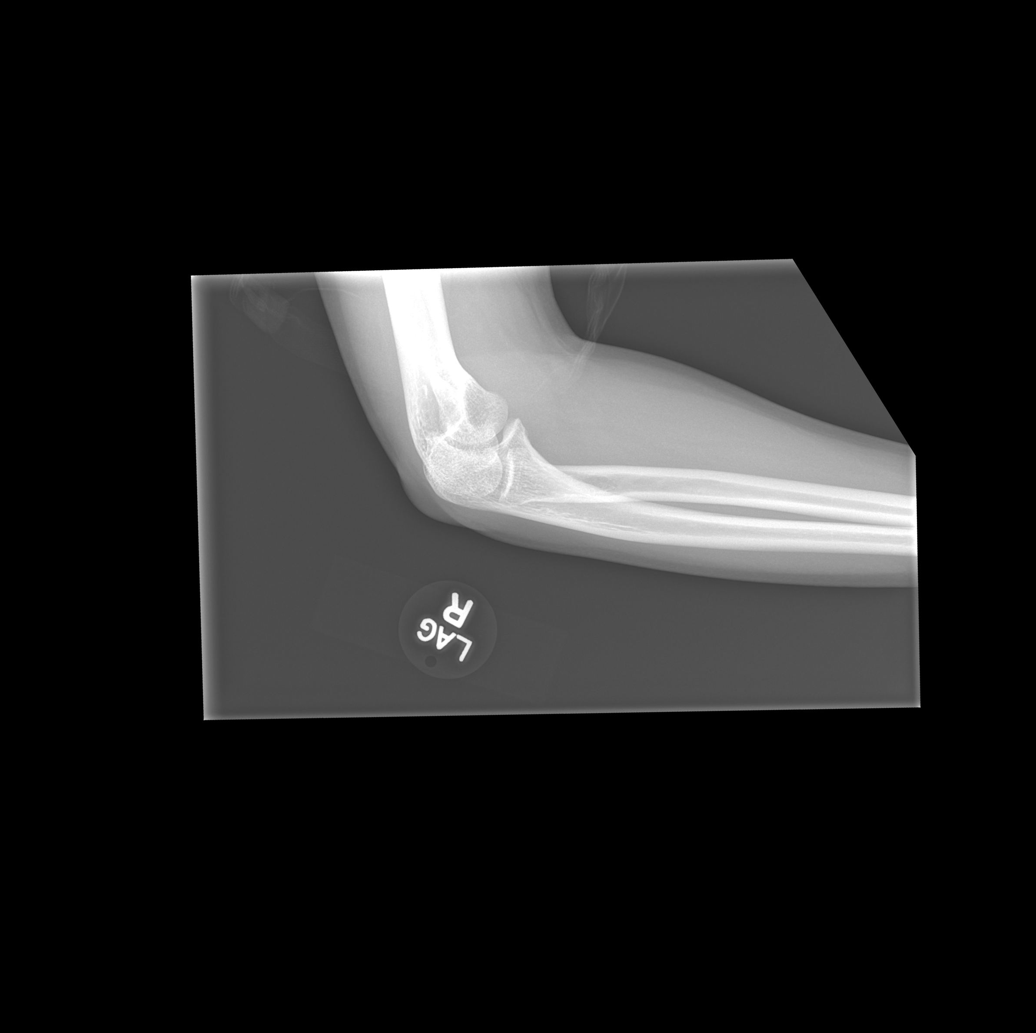

[4 of 4 positions shown; findings below may reference images not displayed]

FINDINGS: There is no evidence of fracture, dislocation, or joint effusion.
There is no evidence of arthropathy or other focal bone abnormality.
Soft tissues are unremarkable.
IMPRESSION: Negative.

## 2019-03-03 ENCOUNTER — Emergency Department (HOSPITAL_COMMUNITY)
Admission: EM | Admit: 2019-03-03 | Discharge: 2019-03-05 | Disposition: A | Discharge status: Other Acute Inpatient Hospital | Payer: Medicaid Other | Attending: Emergency Medicine | Admitting: Emergency Medicine

## 2019-03-03 ENCOUNTER — Other Ambulatory Visit: Payer: Self-pay

## 2019-03-03 DIAGNOSIS — X781XXA Intentional self-harm by knife, initial encounter: Secondary | ICD-10-CM | POA: Diagnosis not present

## 2019-03-03 DIAGNOSIS — Z046 Encounter for general psychiatric examination, requested by authority: Secondary | ICD-10-CM

## 2019-03-03 DIAGNOSIS — R4689 Other symptoms and signs involving appearance and behavior: Secondary | ICD-10-CM | POA: Diagnosis present

## 2019-03-03 DIAGNOSIS — F17200 Nicotine dependence, unspecified, uncomplicated: Secondary | ICD-10-CM | POA: Insufficient documentation

## 2019-03-03 DIAGNOSIS — Z7289 Other problems related to lifestyle: Secondary | ICD-10-CM

## 2019-03-03 DIAGNOSIS — F333 Major depressive disorder, recurrent, severe with psychotic symptoms: Secondary | ICD-10-CM | POA: Diagnosis not present

## 2019-03-03 DIAGNOSIS — Z23 Encounter for immunization: Secondary | ICD-10-CM | POA: Diagnosis not present

## 2019-03-03 DIAGNOSIS — F332 Major depressive disorder, recurrent severe without psychotic features: Secondary | ICD-10-CM | POA: Diagnosis present

## 2019-03-03 LAB — CBC
HEMATOCRIT: 34.2 % — AB (ref 36.0–46.0)
HEMOGLOBIN: 10.5 g/dL — AB (ref 12.0–15.0)
MCH: 32.5 pg (ref 26.0–34.0)
MCHC: 30.7 g/dL (ref 30.0–36.0)
MCV: 105.9 fL — ABNORMAL HIGH (ref 80.0–100.0)
Platelets: 145 10*3/uL — ABNORMAL LOW (ref 150–400)
RBC: 3.23 MIL/uL — AB (ref 3.87–5.11)
RDW: 12.1 % (ref 11.5–15.5)
WBC: 5.1 10*3/uL (ref 4.0–10.5)
nRBC: 0 % (ref 0.0–0.2)

## 2019-03-03 LAB — COMPREHENSIVE METABOLIC PANEL
ALBUMIN: 3.8 g/dL (ref 3.5–5.0)
ALK PHOS: 61 U/L (ref 38–126)
ALT: 38 U/L (ref 0–44)
ANION GAP: 10 (ref 5–15)
AST: 57 U/L — AB (ref 15–41)
BILIRUBIN TOTAL: 0.9 mg/dL (ref 0.3–1.2)
BUN: 11 mg/dL (ref 6–20)
CALCIUM: 8.3 mg/dL — AB (ref 8.9–10.3)
CO2: 24 mmol/L (ref 22–32)
CREATININE: 0.59 mg/dL (ref 0.44–1.00)
Chloride: 106 mmol/L (ref 98–111)
GFR calc Af Amer: >60 mL/min (ref >60)
GFR calc non Af Amer: >60 mL/min (ref >60)
GLUCOSE: 96 mg/dL (ref 70–99)
Potassium: 3.1 mmol/L — ABNORMAL LOW (ref 3.5–5.1)
SODIUM: 140 mmol/L (ref 135–145)
TOTAL PROTEIN: 6.6 g/dL (ref 6.5–8.1)

## 2019-03-03 LAB — ACETAMINOPHEN LEVEL: Acetaminophen (Tylenol), Serum: <10 ug/mL — ABNORMAL LOW (ref 10–30)

## 2019-03-03 LAB — SALICYLATE LEVEL: Salicylate Lvl: <7 mg/dL (ref 2.8–30.0)

## 2019-03-03 LAB — I-STAT BETA HCG BLOOD, ED (MC, WL, AP ONLY): I-stat hCG, quantitative: <5 m[IU]/mL (ref <5)

## 2019-03-03 LAB — ETHANOL: Alcohol, Ethyl (B): <10 mg/dL (ref <10)

## 2019-03-03 MED ORDER — TETANUS-DIPHTH-ACELL PERTUSSIS 5-2.5-18.5 LF-MCG/0.5 IM SUSP
0.5000 mL | Freq: Once | INTRAMUSCULAR | Status: AC
  Administered 2019-03-03: 0.5 mL via INTRAMUSCULAR
  Filled 2019-03-03: qty 0.5

## 2019-03-03 NOTE — ED Triage Notes (Signed)
Pt transported by [facility]. EMS said the following,  pt doesn't want men to provide care for her. She was combative during transport. Upon arrival she refused vitals and care. Pt has reports of self inflicted lacerations to chest and bilateral upper groin.
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
## 2019-03-03 NOTE — ED Notes (Signed)
Asked pt if I could place BP cuff on her arm and obtain V/S Pt refused stating, "I don't feel safe with that on my arm." RN at bedside and aware

## 2019-03-03 NOTE — ED Notes (Signed)
Kelly, PA at bedside. 
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
## 2019-03-03 NOTE — ED Notes (Signed)
Pt gets upset with female presence in room.  Pt redirected and oriented as needed. Law enforcement at bedside for one on one.  Female care per request.

## 2019-03-03 NOTE — ED Notes (Signed)
Pt in room resting with eyes closed at this time

## 2019-03-04 LAB — RAPID URINE DRUG SCREEN, HOSP PERFORMED
Amphetamines: POSITIVE — AB
BARBITURATES: NOT DETECTED
BENZODIAZEPINES: NOT DETECTED
COCAINE: POSITIVE — AB
OPIATES: NOT DETECTED
Tetrahydrocannabinol: NOT DETECTED

## 2019-03-04 MED ORDER — POTASSIUM CHLORIDE CRYS ER 20 MEQ PO TBCR
40.0000 meq | EXTENDED_RELEASE_TABLET | Freq: Once | ORAL | Status: AC
  Administered 2019-03-04: 40 meq via ORAL
  Filled 2019-03-04: qty 2

## 2019-03-04 MED ORDER — TRAZODONE HCL 100 MG PO TABS
100.0000 mg | ORAL_TABLET | Freq: Every day | ORAL | Status: DC
  Filled 2019-03-04: qty 1

## 2019-03-04 MED ORDER — HYDROXYZINE HCL 25 MG PO TABS: 25.0000 mg | ORAL_TABLET | Freq: Three times a day (TID) | ORAL | Status: DC | PRN

## 2019-03-04 MED ORDER — ACETAMINOPHEN 325 MG PO TABS: 650.0000 mg | ORAL_TABLET | Freq: Four times a day (QID) | ORAL | Status: DC | PRN

## 2019-03-04 MED ORDER — GABAPENTIN 300 MG PO CAPS
300.0000 mg | ORAL_CAPSULE | Freq: Three times a day (TID) | ORAL | Status: DC
  Administered 2019-03-05: 300 mg via ORAL
  Filled 2019-03-04 (×2): qty 1

## 2019-03-04 MED ORDER — DIPHENHYDRAMINE HCL 50 MG/ML IJ SOLN
50.0000 mg | Freq: Once | INTRAMUSCULAR | Status: AC | PRN
  Administered 2019-03-04: 50 mg via INTRAMUSCULAR
  Filled 2019-03-04: qty 1

## 2019-03-04 MED ORDER — ZIPRASIDONE MESYLATE 20 MG IM SOLR
10.0000 mg | Freq: Once | INTRAMUSCULAR | Status: AC
  Administered 2019-03-04: 10 mg via INTRAMUSCULAR
  Filled 2019-03-04: qty 20

## 2019-03-04 MED ORDER — LORAZEPAM 2 MG/ML IJ SOLN
2.0000 mg | Freq: Once | INTRAMUSCULAR | Status: AC | PRN
  Administered 2019-03-04: 2 mg via INTRAMUSCULAR
  Filled 2019-03-04: qty 1

## 2019-03-04 MED ORDER — OLANZAPINE 5 MG PO TABS
5.0000 mg | ORAL_TABLET | Freq: Every day | ORAL | Status: DC
  Filled 2019-03-04: qty 1

## 2019-03-04 NOTE — ED Notes (Signed)
Pt continues to want to leave.  She denies S/I and H/I and AVH.  Pt does however have cuts circling her breast.  They look like the are healing but are pretty severe.  She also has a older cut down the front of her throat.  It appears two or so days old.  Patient continues to ask the same thing over and over.  I am not sure if she is aware or not.

## 2019-03-04 NOTE — ED Notes (Signed)
Female TTS provider into see pt , pt refused to wake from sleep no report of medication for anxiety, hallucinations or sleep given in the past 24 hours.

## 2019-03-04 NOTE — ED Notes (Signed)
Bed: OH60 Expected date:  Expected time:  Means of arrival:  Comments: No bed

## 2019-03-04 NOTE — ED Notes (Signed)
Security wanded patient 

## 2019-03-04 NOTE — ED Notes (Signed)
EDP Preston Fleeting notified of TTS decision to advise admission.

## 2019-03-04 NOTE — ED Notes (Signed)
Pt reminded once again that we need a urine sample from her, she was made aware it will speed up the process of ordering her medications and deciding her care. Pt verbalized understanding and supposedly attempt to urinate, however sts she is not able yet.
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
## 2019-03-04 NOTE — [facility] Counselor (Signed)
Pt declined, clinician contacting family, friend supports to gather additional information.   Redmond Pulling, MS, [facility], [facility] Triage Specialist [phone]

## 2019-03-04 NOTE — ED Notes (Signed)
Patient brought back TCU room 33. Dressed out in paper scrubs and belongings placed in locker 33. Security coming to wand patient.

## 2019-03-04 NOTE — ED Notes (Signed)
Attempted to assess pt, she will not answer any of the questions asked and is  she avoiding eye contact. Pt only stated "I am ready to leave now". Advised pt that she can't leave at this time and she will have to talk to Dr during the rounding to find out more. Pt did not respond to that.

## 2019-03-04 NOTE — ED Notes (Signed)
Pt oriented to room and unit.  Pt is bizarre and uninterested.  15 minute checks and video monitoring in place.

## 2019-03-04 NOTE — ED Notes (Signed)
ED TO INPATIENT HANDOFF REPORT  ED Nurse Name and Phone #:  [facility]  S Name/Age/Gender Melinda Velez 33 y.o. female Room/Bed: WA18/WA18  Code Status   Code Status: Full Code  Home/SNF/Other Home Patient oriented to: self Is this baseline? Yes   Triage Complete: Triage complete  Chief Complaint Laceration;Psych evaluation  Triage Note Pt transported by [facility]. EMS said the following,  pt doesn't want men to provide care for her. She was combative during transport. Upon arrival she refused vitals and care. Pt has reports of self inflicted lacerations to chest and bilateral upper groin.   Allergies No Known Allergies  Level of Care/Admitting Diagnosis ED Disposition    None      B Medical/Surgery History No past medical history on file. No past surgical history on file.   A IV Location/Drains/Wounds Patient Lines/Drains/Airways Status   Active Line/Drains/Airways    None          Intake/Output Last 24 hours No intake or output data in the 24 hours ending 03/04/19 0006  Labs/Imaging Results for orders placed or performed during the hospital encounter of 03/03/19 (from the past 48 hour(s))  CBC     Status: Abnormal   Collection Time: 03/03/19 11:06 PM  Result Value Ref Range   WBC 5.1 4.0 - 10.5 K/uL   RBC 3.23 (L) 3.87 - 5.11 MIL/uL   Hemoglobin 10.5 (L) 12.0 - 15.0 g/dL   HCT 78.6 (L) 76.7 - 20.9 %   MCV 105.9 (H) 80.0 - 100.0 fL   MCH 32.5 26.0 - 34.0 pg   MCHC 30.7 30.0 - 36.0 g/dL   RDW 47.0 96.2 - 83.6 %   Platelets 145 (L) 150 - 400 K/uL   nRBC 0.0 0.0 - 0.2 %    Comment: Performed at [facility], [address], [place], [place] [address]  Comprehensive metabolic panel     Status: Abnormal   Collection Time: 03/03/19 11:06 PM  Result Value Ref Range   Sodium 140 135 - 145 mmol/L   Potassium 3.1 (L) 3.5 - 5.1 mmol/L   Chloride 106 98 - 111 mmol/L   CO2 24 22 - 32 mmol/L   Glucose, Bld 96 70 - 99 mg/dL    BUN 11 6 - 20 mg/dL   Creatinine, Ser 6.54 0.44 - 1.00 mg/dL   Calcium 8.3 (L) 8.9 - 10.3 mg/dL   Total Protein 6.6 6.5 - 8.1 g/dL   Albumin 3.8 3.5 - 5.0 g/dL   AST 57 (H) 15 - 41 U/L   ALT 38 0 - 44 U/L   Alkaline Phosphatase 61 38 - 126 U/L   Total Bilirubin 0.9 0.3 - 1.2 mg/dL   GFR calc non Af Amer >60 >60 mL/min   GFR calc Af Amer >60 >60 mL/min   Anion gap 10 5 - 15    Comment: Performed at [facility], [address] [place], [place] [address]  Acetaminophen level     Status: Abnormal   Collection Time: 03/03/19 11:06 PM  Result Value Ref Range   Acetaminophen (Tylenol), Serum <10 (L) 10 - 30 ug/mL    Comment: (NOTE) Therapeutic concentrations vary significantly. A range of 10-30 ug/mL  may be an effective concentration for many patients. However, some  are best treated at concentrations outside of this range. Acetaminophen concentrations >150 ug/mL at 4 hours after ingestion  and >50 ug/mL at 12 hours after ingestion are often associated with  toxic reactions. Performed at [facility], [address] [place], [place] [address]   Salicylate level     Status: None   Collection Time: 03/03/19 11:06 PM  Result Value Ref Range   Salicylate Lvl <7.0 2.8 - 30.0 mg/dL    Comment: Performed at [facility], [address], [place], [place] [address]  Ethanol     Status: None   Collection Time: 03/03/19 11:06 PM  Result Value Ref Range   Alcohol, Ethyl (B) <10 <10 mg/dL    Comment: (NOTE) Lowest detectable limit for serum alcohol is 10 mg/dL. For medical purposes only. Performed at [facility], [address] [place], [place] [address]   I-Stat Beta hCG blood, ED ([facility], [facility], [facility] only)     Status: None   Collection Time: 03/03/19 11:15 PM  Result Value Ref Range   I-stat hCG, quantitative <5.0 <5 mIU/mL   Comment 3            Comment:   GEST. AGE      CONC.  (mIU/mL)   <=1 WEEK        5 -  50     2 WEEKS       50 - 500     3 WEEKS       100 - 10,000     4 WEEKS     1,000 - 30,000        FEMALE AND NON-PREGNANT FEMALE:     LESS THAN 5 mIU/mL    No results found.  Pending Labs Unresulted Labs (From admission, onward)    Start     Ordered   03/03/19 2300  Rapid urine drug screen (hospital performed)  ONCE - STAT,   R     03/03/19 2259          Vitals/Pain Today's Vitals   03/03/19 2236 03/03/19 2307 03/03/19 2327 03/04/19 0001  BP: 140/72 138/76  132/72  Pulse: 88 82  89  Resp: 19 18  17   Temp:  98.2 F (36.8 C)  98.2 F (36.8 C)  TempSrc:  Axillary  Oral  SpO2: 99% 100%  99%  PainSc:   0-No pain     Isolation Precautions No active isolations  Medications Medications  Tdap (BOOSTRIX) injection 0.5 mL (0.5 mLs Intramuscular Given 03/03/19 2315)    Mobility walks Low fall risk   Focused Assessments Neuro Assessment Handoff:  Swallow screen pass? Yes  Cardiac Rhythm: Normal sinus rhythm       Neuro Assessment:   Neuro Checks:      Last Documented NIHSS Modified Score:   Has TPA been given? No If patient is a Neuro Trauma and patient is going to OR before floor call report to 4N Charge nurse: [phone] or [phone]     R Recommendations: See Admitting Provider Note  Report given to:   Additional Notes: IVC

## 2019-03-04 NOTE — BHH Counselor (Signed)
Clinician attempted to engage pt in assessment (calling pt's name, moving the bed) however pt continued sleeping. Discussed with Reita Cliche, RN. Clinician to assess pt once alert, roused, and able to engage.   Redmond Pulling, MS, [facility], [facility] Triage Specialist [phone]

## 2019-03-04 NOTE — BH Assessment (Addendum)
Assessment Note  Melinda Velez is an 33 y.o. female, who presents involuntary and unaccompanied to [facility]. Pt was a poor historian during the assessment, pt was consistently redirected to engage (pt kept failing nodding off), and answering "no" to questions when answer is open ended. Clinician asked the pt, "what brought you to the hospital?" Pt reported, "injury wound, scrape leg. Pt would not show clinician her wound or discussed how she got the wound. Pt reported, there was medium amount of blood. Clinician observed a cut down the middle of the pt's chest with dried blood. Pt denies, SI, HI, AVH, self-injurious behaviors and access to weapons.   Pt was IVC's by police officers. Per IVC paperwork: "Upon [employer] respondent has a self-inflicted stab wound. Suicide attempt. Respondent is hallucinating advertising that things and people are out to get her and isn't making any sense in the things she says. Respondent also combative with staff."     Pt did not answer history of abuse question. Pt denies, substance abuse. Pt denies, being linked to OPT resources (medication management and/or counseling.) Pt has previous inpatient admission at [facility].    Pt presents sleeping in scrubs with soft speech. Pt's eye contact was poor. Pt's mood was apathic. Pt's affect was flat. Pt's thought process was circumstantial and thought blocking. Pt's judgement was impaired. Pt was oriented x4. Pt's concentration, insight and impulse control are poor. Pt reported, if discharged from [facility] she can contract for safety.   Diagnosis: Major Depressive Disorder, recurrent, severe with psychotic features.   Past Medical History: No past medical history on file.  No past surgical history on file.  Family History: No family history on file.  Social History:  reports that she has been smoking. She has never used smokeless tobacco. She reports that she does not drink alcohol. No history on file for  drug.  Additional Social History:  Alcohol / Drug Use Pain Medications: See MAR Prescriptions: See MAR Over the Counter: See MAR History of alcohol / drug use?: (Pt denies. Pt's UDS is pending.)  CIWA: CIWA-Ar BP: 108/71 Pulse Rate: 86 COWS:    Allergies: No Known Allergies  Home Medications: (Not in a hospital admission)   OB/GYN Status:  No LMP recorded.  General Assessment Data Assessment unable to be completed: Yes Reason for not completing assessment: Clinician attempted to engage pt in assessment (calling pt's name, moving the bed) however pt continued sleeping. Discussed with Reita Cliche, RN. Clinician to assess pt once alert, roused, and able to engage.  [facility] of Assessment: [facility] TTS Assessment: In system Is this a Tele or Face-to-Face Assessment?: Face-to-Face Is this an Initial Assessment or a Re-assessment for this encounter?: Initial Assessment Patient Accompanied by:: N/A Language Other than English: No Living Arrangements: Other (Comment)(Alone. ) What gender do you identify as?: Female Marital status: Single Living Arrangements: Alone Can pt return to current living arrangement?: Yes Admission Status: Involuntary Petitioner: Police Is patient capable of signing voluntary admission?: No Referral Source: Other([employer].) Insurance type: Medicaid.      Crisis Care Plan Living Arrangements: Alone Legal Guardian: Other:(Self. ) Name of Psychiatrist: NA Name of Therapist: NA  Education Status Is patient currently in school?: (UTA)  Risk to self with the past 6 months Suicidal Ideation: Yes-Currently Present(Per IVC however pt denies. ) Has patient been a risk to self within the past 6 months prior to admission? : Yes Suicidal Intent: Yes-Currently Present(Per IVC however pt denies. ) Has patient had any suicidal  intent within the past 6 months prior to admission? : Yes Is patient at risk for suicide?: Yes Suicidal Plan?: Yes-Currently Present Has patient  had any suicidal plan within the past 6 months prior to admission? : Yes Specify Current Suicidal Plan: Per IVC, pt has self-inflicted stab wounds, suicide attempt.  Access to Means: Yes Specify Access to Suicidal Means: Pt used an object to stab herself.(Pt denies, access to weapons. ) What has been your use of drugs/alcohol within the last 12 months?: UDS is pending. Previous Attempts/Gestures: (UTA) Other Self Harm Risks: Cutting/stabbing self. Triggers for Past Attempts: (UTA) Intentional Self Injurious Behavior: Cutting Comment - Self Injurious Behavior: Pt cutting/stabbing self. Family Suicide History: Unable to assess Recent stressful life event(s): (Pt denies, stressors. ) Persecutory voices/beliefs?: (UTA) Depression: No(Pt denies, depressive symptoms. ) Substance abuse history and/or treatment for substance abuse?: (UTA) Suicide prevention information given to non-admitted patients: Not applicable  Risk to Others within the past 6 months Homicidal Ideation: No(Pt denies. ) Does patient have any lifetime risk of violence toward others beyond the six months prior to admission? : No(Pt denies. ) Thoughts of Harm to Others: No(Pt denies. ) Current Homicidal Intent: No Current Homicidal Plan: No(Pt denies. ) Access to Homicidal Means: No Identified Victim: NA History of harm to others?: No(Pt denies. ) Assessment of Violence: None Noted Violent Behavior Description: NA Does patient have access to weapons?: No(Pt denies. ) Criminal Charges Pending?: No Does patient have a court date: No Is patient on probation?: No  Psychosis Hallucinations: Auditory, Visual(Per IVC however pt denies. )  Mental Status Report Appearance/Hygiene: In scrubs Eye Contact: Poor Motor Activity: Unremarkable Speech: Soft Level of Consciousness: Sleeping Mood: Apathetic Affect: Flat Anxiety Level: Minimal Thought Processes: Circumstantial, Thought Blocking Judgement: Impaired Orientation:  Person, Place, Time Obsessive Compulsive Thoughts/Behaviors: None  Cognitive Functioning Concentration: Poor Memory: Recent Impaired, Remote Impaired Is patient IDD: No Insight: Poor Impulse Control: Poor Appetite: Fair Have you had any weight changes? : No Change Sleep: Unable to Assess Vegetative Symptoms: Unable to Assess  ADLScreening [facility]HH Assessment Services) Patient's cognitive ability adequate to safely complete daily activities?: Yes Patient able to express need for assistance with ADLs?: Yes Independently performs ADLs?: Yes (appropriate for developmental age)  Prior Inpatient Therapy Prior Inpatient Therapy: Yes Prior Therapy Dates: UTA Prior Therapy Facilty/Provider(s): [facility].  Reason for Treatment: UTA  Prior Outpatient Therapy Prior Outpatient Therapy: (UTA)  ADL Screening (condition at time of admission) Patient's cognitive ability adequate to safely complete daily activities?: Yes Is the patient deaf or have difficulty hearing?: No Does the patient have difficulty seeing, even when wearing glasses/contacts?: No Does the patient have difficulty concentrating, remembering, or making decisions?: Yes Patient able to express need for assistance with ADLs?: Yes Does the patient have difficulty dressing or bathing?: No Independently performs ADLs?: Yes (appropriate for developmental age) Does the patient have difficulty walking or climbing stairs?: No Weakness of Legs: None Weakness of Arms/Hands: None  Home Assistive Devices/Equipment Home Assistive Devices/Equipment: None    Abuse/Neglect Assessment (Assessment to be complete while patient is alone) Abuse/Neglect Assessment Can Be Completed: (Pt would not answer abuse questions. )     Advance Directives (For Healthcare) Does Patient Have a Medical Advance Directive?: No          Disposition: Donell SievertSpencer Simon, PA recommends inpatient treatment. Disposition discussed with Reita ClicheBobby, RN. Reita ClicheBobby, RN to inform  EDP of disposition.    Disposition Initial Assessment Completed for this Encounter: Yes  On Site Evaluation  by: Redmond Pulling, MS, [facility], [facility]. Reviewed with Physician: Donell Sievert, PA.  Redmond Pulling 03/04/2019 6:38 AM    Redmond Pulling, MS, [facility], [facility] Triage Specialist [phone]

## 2019-03-04 NOTE — BH Assessment (Addendum)
[facility] Assessment Progress Note  Per Juanetta Beets, DO, this pt requires psychiatric hospitalization at this time.  Pt presents under IVC initiated by law enforcement, and upheld by EDP Dione Booze, MD.  The following facilities have been contacted to seek placement for this pt, with results as noted:  Beds available, information sent, decision pending:  [facility]    At capacity:  Eugenio Hoes, [place] Behavioral Health Coordinator [phone]

## 2019-03-05 DIAGNOSIS — F332 Major depressive disorder, recurrent severe without psychotic features: Secondary | ICD-10-CM | POA: Diagnosis present

## 2019-03-05 NOTE — BHH Counselor (Signed)
Per Annice Pih at [facility] pt has been accepted and assigned to [facility]. Attending physician: Dr. Sallyanne Kuster. Pt can come after 1000. Discussed with Randa Evens, RN.    Redmond Pulling, MS, [facility], [facility] Triage Specialist [phone]

## 2019-03-05 NOTE — ED Notes (Addendum)
Up to the bathroom 

## 2019-03-05 NOTE — ED Notes (Signed)
Sheriff is here to transport 

## 2019-03-05 NOTE — ED Notes (Signed)
Pt sleeping, easily aroused.  Pt is aware that she is going to be transferred to [facility].  Pt also reports that she does not have a legal guardian.

## 2019-03-05 NOTE — ED Notes (Signed)
In the shower 

## 2019-03-05 NOTE — ED Notes (Addendum)
Snack given.  Pt is aware that the sheriff will be here in approx 20 mins to transport

## 2019-03-05 NOTE — Progress Notes (Addendum)
Received Melinda Velez at the change of shift asleep in bed. She continued to sleep throughout the night without incident. She has a bed at [facility] this morning per TSS.

## 2019-03-05 NOTE — ED Notes (Addendum)
Pt ambulatory w/o difficulty to [facility] with sheriff.  IVC papers, MAR, facesheet, transport report, EMTELA, and belongings given to sheriff.

## 2019-03-05 NOTE — ED Notes (Signed)
Sheriff will be here to0 transport approx 12:00p

## 2019-04-27 ENCOUNTER — Observation Stay (HOSPITAL_COMMUNITY)
Admission: RE | Admit: 2019-04-27 | Discharge: 2019-04-27 | Disposition: A | Discharge status: Other Acute Inpatient Hospital | Payer: Medicaid Other | Attending: Psychiatry | Admitting: Psychiatry

## 2019-04-27 ENCOUNTER — Other Ambulatory Visit: Payer: Self-pay

## 2019-04-27 ENCOUNTER — Encounter (HOSPITAL_COMMUNITY): Payer: Self-pay

## 2019-04-27 ENCOUNTER — Emergency Department (HOSPITAL_COMMUNITY)
Admission: EM | Admit: 2019-04-27 | Discharge: 2019-04-28 | Disposition: A | Discharge status: Other Acute Inpatient Hospital | Payer: Medicaid Other | Attending: Emergency Medicine | Admitting: Emergency Medicine

## 2019-04-27 DIAGNOSIS — Z9114 Patient's other noncompliance with medication regimen: Secondary | ICD-10-CM | POA: Insufficient documentation

## 2019-04-27 DIAGNOSIS — F329 Major depressive disorder, single episode, unspecified: Secondary | ICD-10-CM | POA: Diagnosis not present

## 2019-04-27 DIAGNOSIS — Z046 Encounter for general psychiatric examination, requested by authority: Secondary | ICD-10-CM | POA: Diagnosis present

## 2019-04-27 DIAGNOSIS — F209 Schizophrenia, unspecified: Principal | ICD-10-CM | POA: Insufficient documentation

## 2019-04-27 DIAGNOSIS — F17200 Nicotine dependence, unspecified, uncomplicated: Secondary | ICD-10-CM | POA: Insufficient documentation

## 2019-04-27 DIAGNOSIS — F22 Delusional disorders: Secondary | ICD-10-CM | POA: Diagnosis not present

## 2019-04-27 DIAGNOSIS — Z91148 Patient's other noncompliance with medication regimen for other reason: Secondary | ICD-10-CM

## 2019-04-27 LAB — CBC
HCT: 39.5 % (ref 36.0–46.0)
Hemoglobin: 12.4 g/dL (ref 12.0–15.0)
MCH: 30.2 pg (ref 26.0–34.0)
MCHC: 31.4 g/dL (ref 30.0–36.0)
MCV: 96.3 fL (ref 80.0–100.0)
Platelets: 230 10*3/uL (ref 150–400)
RBC: 4.1 MIL/uL (ref 3.87–5.11)
RDW: 13 % (ref 11.5–15.5)
WBC: 6.7 10*3/uL (ref 4.0–10.5)
nRBC: 0 % (ref 0.0–0.2)

## 2019-04-27 LAB — I-STAT BETA HCG BLOOD, ED (MC, WL, AP ONLY): I-stat hCG, quantitative: <5 m[IU]/mL (ref <5)

## 2019-04-27 LAB — COMPREHENSIVE METABOLIC PANEL
ALT: 17 U/L (ref 0–44)
AST: 20 U/L (ref 15–41)
Albumin: 3.9 g/dL (ref 3.5–5.0)
Alkaline Phosphatase: 70 U/L (ref 38–126)
Anion gap: 9 (ref 5–15)
BUN: 12 mg/dL (ref 6–20)
CO2: 25 mmol/L (ref 22–32)
Calcium: 9.4 mg/dL (ref 8.9–10.3)
Chloride: 104 mmol/L (ref 98–111)
Creatinine, Ser: 0.78 mg/dL (ref 0.44–1.00)
GFR calc Af Amer: >60 mL/min (ref >60)
GFR calc non Af Amer: >60 mL/min (ref >60)
Glucose, Bld: 94 mg/dL (ref 70–99)
Potassium: 3.9 mmol/L (ref 3.5–5.1)
Sodium: 138 mmol/L (ref 135–145)
Total Bilirubin: 0.6 mg/dL (ref 0.3–1.2)
Total Protein: 7.1 g/dL (ref 6.5–8.1)

## 2019-04-27 LAB — RAPID URINE DRUG SCREEN, HOSP PERFORMED
Amphetamines: NOT DETECTED
Barbiturates: NOT DETECTED
Benzodiazepines: NOT DETECTED
Cocaine: POSITIVE — AB
Opiates: NOT DETECTED
Tetrahydrocannabinol: NOT DETECTED

## 2019-04-27 LAB — ACETAMINOPHEN LEVEL: Acetaminophen (Tylenol), Serum: <10 ug/mL — ABNORMAL LOW (ref 10–30)

## 2019-04-27 LAB — TSH: TSH: 0.73 u[IU]/mL (ref 0.350–4.500)

## 2019-04-27 LAB — SALICYLATE LEVEL: Salicylate Lvl: <7 mg/dL (ref 2.8–30.0)

## 2019-04-27 LAB — ETHANOL: Alcohol, Ethyl (B): <10 mg/dL (ref <10)

## 2019-04-27 NOTE — ED Provider Notes (Addendum)
[facility] Provider Note   CSN: [id number] Arrival date & time: 04/27/19  2030    History   Chief Complaint Chief Complaint  Patient presents with  . IVC    HPI Melinda Mooreiffany D Velez is a 33 y.o. female with a PMH of Major Depressive Disorder, Aggressive behavior, and Schizophrenia presenting for not sleeping, not tending to personal hygiene, and non compliant with medications. Patient was brought by [employer]. Patient has been IVC'd by caregiver. Per [employer], patient has been having thoughts she is getting abducted and not taking her medications. Patient is a poor historian due to psychiatric condition. Patient states, "I don't want to be possessed anymore." Patient does not respond when asked about auditory or visual hallucinations. Patient denies HI/SI. Patient denies any fever, chills, cough, congestion, nausea, vomiting, abdominal pain, or sick exposures. Patient denies alcohol, tobacco, or drug use. Patient was evaluated by Dr. Lucianne MussKumar in behavioral health earlier today and was advised to come to the ER for medical clearance prior to admission.  Level 5 caveat.      HPI  History reviewed. No pertinent past medical history.  Patient Active Problem List   Diagnosis Date Noted  . Major depressive disorder, recurrent severe without psychotic features (HCC) 03/05/2019  . Aggressive behavior, adult 10/18/2016    History reviewed. No pertinent surgical history.   OB History   No obstetric history on file.      Home Medications    Prior to Admission medications   Not on File    Family History No family history on file.  Social History Social History   Tobacco Use  . Smoking status: Current Every Day Smoker  . Smokeless tobacco: Never Used  Substance Use Topics  . Alcohol use: No  . Drug use: Not on file    Comment: currently incarcerated     Allergies   Patient has no known allergies.   Review of Systems Review of Systems  Unable  to perform ROS: Psychiatric disorder     Physical Exam Updated Vital Signs BP 114/79 (BP Location: Right Arm)   Pulse 73   Temp 97.8 F (36.6 C) (Oral)   Resp 16   Ht 5\' 5"  (1.651 m)   Wt 50 kg   SpO2 100%   BMI 18.34 kg/m   Physical Exam Vitals signs and nursing note reviewed.  Constitutional:      General: She is not in acute distress.    Appearance: She is well-developed. She is not diaphoretic.     Comments: On initial exam, patient to smiling and laughing, but has intermittent episodes of becoming nonverbal. Towards the end of the exam, patient begins to cry and keeps saying that she does not want to be possessed.   HENT:     Head: Normocephalic and atraumatic.  Cardiovascular:     Rate and Rhythm: Normal rate and regular rhythm.     Heart sounds: Normal heart sounds. No murmur. No friction rub. No gallop.   Pulmonary:     Effort: Pulmonary effort is normal. No respiratory distress.     Breath sounds: Normal breath sounds. No wheezing or rales.  Abdominal:     Palpations: Abdomen is soft.     Tenderness: There is no abdominal tenderness.  Musculoskeletal: Normal range of motion.  Skin:    General: Skin is warm.     Findings: No erythema, lesion or rash.  Neurological:     Mental Status: She is alert and  oriented to person, place, and time.  Psychiatric:        Attention and Perception: She is inattentive.        Mood and Affect: Mood is not anxious or depressed. Affect is labile and tearful. Affect is not angry.        Speech: Speech is tangential.        Behavior: Behavior is not aggressive. Behavior is cooperative.        Thought Content: Thought content is delusional.        Judgment: Judgment is inappropriate.     ED Treatments / Results  Labs (all labs ordered are listed, but only abnormal results are displayed) Labs Reviewed  ACETAMINOPHEN LEVEL - Abnormal; Notable for the following components:      Result Value   Acetaminophen (Tylenol), Serum <10  (*)    All other components within normal limits  RAPID URINE DRUG SCREEN, HOSP PERFORMED - Abnormal; Notable for the following components:   Cocaine POSITIVE (*)    All other components within normal limits  COMPREHENSIVE METABOLIC PANEL  ETHANOL  SALICYLATE LEVEL  CBC  TSH  I-STAT BETA HCG BLOOD, ED ([facility], [facility], [facility] ONLY)    EKG None  Radiology No results found.  Procedures Procedures (including critical care time)  Medications Ordered in ED Medications - No data to display   Initial Impression / Assessment and Plan / ED Course  I have reviewed the triage vital signs and the nursing notes.  Pertinent labs & imaging results that were available during my care of the patient were reviewed by me and considered in my medical decision making (see chart for details).  Clinical Course as of Apr 26 2330  Wed Apr 27, 2019  2239 Attempted to call Shavona Mazeika but she did not answer.    [AH]  2329 UDS positive for cocaine.   COCAINE(!): POSITIVE [AH]    Clinical Course User Index [AH] Leretha Dykes, PA-C      Patient presents to the ER with due to delusions, trouble sleeping, and medication non compliance. Patient has been IVC'd by caregiver. Attempted to call caregiver without response. Current Plan is to have patient return to behavioral health for admission. We have discussed that If the patient feels she was becoming unsafe, instead of acting on an impulse of self harm she will contact the crisis line, speak to family about it, or return to the emergency department. Patient has been cleared to move to [facility].  Final Clinical Impressions(s) / ED Diagnoses   Final diagnoses:  Delusions (HCC)  Non compliance w medication regimen    ED Discharge Orders    None       Leretha Dykes, PA-C 04/27/19 2332    Leretha Dykes, [place] 04/27/19 2357    Arby Barrette, MD 04/28/19 1735

## 2019-04-27 NOTE — ED Triage Notes (Signed)
Pt arrives [employer] custody after mother IVCd her d/t not sleeping or tending to personal hygiene. Pt is agitated and uncooperative in triage. Per IVC paperwork, pt non compliant w/ meds.
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
## 2019-04-27 NOTE — Discharge Instructions (Signed)
You have been seen today for medical clearance for behavioral health assessment. Please read and follow all provided instructions.   1. Medications: usual home medications 2. Treatment: rest, drink plenty of fluids 3. Follow Up: Please follow up with your primary doctor in 2 days for discussion of your diagnoses and further evaluation after today's visit; if you do not have a primary care doctor use the resource guide provided to find one; Please return to the ER for any new or worsening symptoms. Please obtain all of your results from medical records or have your doctors office obtain the results - share them with your doctor - you should be seen at your doctors office. Call today to arrange your follow up.   Take medications as prescribed. Please review all of the medicines and only take them if you do not have an allergy to them. Return to the emergency room for worsening condition or new concerning symptoms. Follow up with your regular doctor. If you don't have a regular doctor use one of the numbers below to establish a primary care doctor.  Please be aware that if you are taking birth control pills, taking other prescriptions, ESPECIALLY ANTIBIOTICS may make the birth control ineffective - if this is the case, either do not engage in sexual activity or use alternative methods of birth control such as condoms until you have finished the medicine and your family doctor says it is OK to restart them. If you are on a blood thinner such as COUMADIN, be aware that any other medicine that you take may cause the coumadin to either work too much, or not enough - you should have your coumadin level rechecked in next 7 days if this is the case.  ?  It is also a possibility that you have an allergic reaction to any of the medicines that you have been prescribed - Everybody reacts differently to medications and while MOST people have no trouble with most medicines, you may have a reaction such as nausea,  vomiting, rash, swelling, shortness of breath. If this is the case, please stop taking the medicine immediately and contact your physician.  ?  You should return to the ER if you develop severe or worsening symptoms.   Emergency Department Resource Guide 1) Find a Doctor and Pay Out of Pocket Although you won't have to find out who is covered by your insurance plan, it is a good idea to ask around and get recommendations. You will then need to call the office and see if the doctor you have chosen will accept you as a new patient and what types of options they offer for patients who are self-pay. Some doctors offer discounts or will set up payment plans for their patients who do not have insurance, but you will need to ask so you aren't surprised when you get to your appointment.  2) Contact Your Local Health Department Not all health departments have doctors that can see patients for sick visits, but many do, so it is worth a call to see if yours does. If you don't know where your local health department is, you can check in your phone book. The CDC also has a tool to help you locate your state's health department, and many state websites also have listings of all of their local health departments.  3) Find a Walk-in Clinic If your illness is not likely to be very severe or complicated, you may want to try a walk in clinic. These are  popping up all over the country in pharmacies, drugstores, and shopping centers. They're usually staffed by nurse practitioners or physician assistants that have been trained to treat common illnesses and complaints. They're usually fairly quick and inexpensive. However, if you have serious medical issues or chronic medical problems, these are probably not your best option.  No Primary Care Doctor: Call Health Connect at  [phone] - they can help you locate a primary care doctor that  accepts your insurance, provides certain services, etc. Physician Referral Service-  [phone]  Chronic Pain Problems: Organization         Address  Phone   Notes  [facility]  [phone] Patients need to be referred by their primary care doctor.   Medication Assistance: Organization         Address  Phone   Notes  [facility] Medication [facility] [address] [place], [place] [address] [phone] --Must be a resident of [facility] -- Must have NO insurance coverage whatsoever (no Medicaid/ Medicare, etc.) -- The pt. MUST have a primary care doctor that directs their care regularly and follows them in the community   [facility]  [phone]   [employer]  [phone]    Agencies that provide inexpensive medical care: Organization         Address  Phone   Notes  [place]  [phone]   [facility]    [phone]   [facility] [place], [place] [address] [phone]   [place] [address], [place] [phone]   [facility]    [phone]   [facility]    [phone]   [place] [facility] [address], [place] Phone:  [phone], Fax:  [phone] Hours of Operation:  9 am - 6 pm, M-F.  Also accepts Medicaid/Medicare and self-pay.  [facility] for [place] [address], [address], [place] Phone: [phone], Fax: [phone]. Hours of Operation:  8:30 am - 5:30 pm, M-F.  Also accepts Medicaid and self-pay.  [facility] [address] [place] Phone: [phone]   [place], [place], [place] [phone], Ext. [phone] Mondays & Thursdays: 7-9 AM.  First 15 patients are seen on a first come, first serve basis.    [place] Providers:  Organization         Address  Phone   Notes  [facility] [address],  [place] [phone] Also accepts self-pay patients.  [facility] [address], [place]  [phone]   [place], [address], [place] [phone]   [facility] [address] [place] [phone]   Lucianne Lei [address], [place]   [phone] Only accepts [place] patients after they have their name applied to their card.   Self-Pay (no insurance) in [facility]:  Organization         Address  Phone   Notes  Sickle Cell Patients, [facility] [place] [phone]   [facility] [place] [phone]   [facility]  [address] [place]  [address], [place] [phone]   [facility]/Dr. Osei-Bonsu  [address], [place] or [address], [place] [phone] Phone number for both [place] and [place] locations is the same.  [facility] [address], [place] [phone]   [facility] [address] [place] or [address] [phone]   [facility] [address] [place] [phone], phone; [phone], fax Sees patients 1st and 3rd Saturday of every month.  Must not qualify for public or private insurance (i.e. Medicaid, Medicare, [place] Health Choice, Veterans' Benefits)  Household income should be no more than 200% of the poverty level The clinic cannot treat you if you are pregnant or think you are pregnant  Sexually transmitted diseases are not treated at the clinic.

## 2019-04-27 NOTE — Progress Notes (Signed)
Pt A&O x 3, pt under IVC, brought in by [employer], presents very Manic and pressured speech, aggressive stance, stating she is going to be killed by unknown individuals, pt will not elaborate further.  Denies SI, HI or AVH.  Pt assessed by TTS and Psych Extender and determination made that pt should be transported to [facility] ED for further treatment.  

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
## 2019-04-28 ENCOUNTER — Other Ambulatory Visit: Payer: Self-pay

## 2019-04-28 ENCOUNTER — Encounter (HOSPITAL_COMMUNITY): Payer: Self-pay | Admitting: Emergency Medicine

## 2019-04-28 ENCOUNTER — Observation Stay (HOSPITAL_COMMUNITY)
Admission: AD | Admit: 2019-04-28 | Discharge: 2019-04-28 | Disposition: A | Discharge status: Home / Self Care | Payer: Medicaid Other | Attending: Psychiatry | Admitting: Psychiatry

## 2019-04-28 DIAGNOSIS — F250 Schizoaffective disorder, bipolar type: Secondary | ICD-10-CM | POA: Insufficient documentation

## 2019-04-28 DIAGNOSIS — F322 Major depressive disorder, single episode, severe without psychotic features: Secondary | ICD-10-CM | POA: Diagnosis not present

## 2019-04-28 DIAGNOSIS — F1290 Cannabis use, unspecified, uncomplicated: Secondary | ICD-10-CM | POA: Insufficient documentation

## 2019-04-28 DIAGNOSIS — F17200 Nicotine dependence, unspecified, uncomplicated: Secondary | ICD-10-CM | POA: Insufficient documentation

## 2019-04-28 DIAGNOSIS — F1420 Cocaine dependence, uncomplicated: Secondary | ICD-10-CM | POA: Insufficient documentation

## 2019-04-28 MED ORDER — TRAZODONE HCL 50 MG PO TABS: 50.0000 mg | ORAL_TABLET | Freq: Every evening | ORAL | Status: DC | PRN

## 2019-04-28 MED ORDER — ACETAMINOPHEN 325 MG PO TABS: 650.0000 mg | ORAL_TABLET | Freq: Four times a day (QID) | ORAL | Status: DC | PRN

## 2019-04-28 MED ORDER — HYDROXYZINE HCL 25 MG PO TABS: 25.0000 mg | ORAL_TABLET | Freq: Four times a day (QID) | ORAL | Status: DC | PRN

## 2019-04-28 MED ORDER — MAGNESIUM HYDROXIDE 400 MG/5ML PO SUSP: 30.0000 mL | Freq: Every day | ORAL | Status: DC | PRN

## 2019-04-28 MED ORDER — ALUM & MAG HYDROXIDE-SIMETH 200-200-20 MG/5ML PO SUSP: 30.0000 mL | ORAL | Status: DC | PRN

## 2019-04-28 NOTE — Progress Notes (Signed)
Patient ID: Melinda Velez, female   DOB: Jun 27, 1986, 33 y.o.   MRN: [id number] Patient discharged to home/self care on her own accord.  Patient denies SI, HI and AVH upon discharge.  Patient acknowledged all discharge instructions and receipt of personal belongings.

## 2019-04-28 NOTE — ED Notes (Signed)
Discharge papers given to [employer] to deliver to [facility] with patient. Phill RN spoke with [facility] as well as Elease Hashimoto from [facility] to inform them of patient's return after being "medically cleared"

## 2019-04-28 NOTE — Discharge Summary (Addendum)
Physician Discharge Summary Note  Patient:  Melinda Velez is an 33 y.o., female MRN:  [id number] DOB:  1986/11/09 Patient phone:  [phone] (home)  Patient address:   [address] [place] [address],  Total Time spent with patient: 30 minutes  Date of Admission:  04/28/2019 Date of Discharge: 04/28/2019  Reason for Admission:  Pt was admitted to the [facility] OBS department for observation due to safety concerns. She had been acting bizarrely at home and the police were called. She was placed under IVC at [facility] and then sent to [facility] for medical clearance. She returned from [facility] and was calm and cooperative overnight. She stated she does not know why the police were called. She denies mental health issues or any need for mental health medication. Pt denies drug and alcohol use but her UDS positive for cocaine and BAL negative. Pt stated she has a job at [employer] and needs to be at work by [employer] She stated she lives alone. She also stated she has a twin and it was her twin that was doing all the things that caused the police to be called. Pt is psychiatrically clear  Principal Problem: MDD (major depressive disorder), severe (HCC) Discharge Diagnoses: Active Problems:   MDD (major depressive disorder), severe (HCC)   Past Psychiatric History: As above  Past Medical History: History reviewed. No pertinent past medical history. History reviewed. No pertinent surgical history. Family History: History reviewed. No pertinent family history. Family Psychiatric  History: Pt denies  Social History:  Social History   Substance and Sexual Activity  Alcohol Use No     Social History   Substance and Sexual Activity  Drug Use Not on file   Comment: currently incarcerated    Social History   Socioeconomic History  . Marital status: Single    Spouse name: Not on file  . Number of children: Not on file  . Years of education: Not on file  . Highest education level: Not on file   Occupational History  . Not on file  Social Needs  . Financial resource strain: Not on file  . Food insecurity:    Worry: Not on file    Inability: Not on file  . Transportation needs:    Medical: Patient refused    Non-medical: Patient refused  Tobacco Use  . Smoking status: Current Every Day Smoker  . Smokeless tobacco: Never Used  Substance and Sexual Activity  . Alcohol use: No  . Drug use: Not on file    Comment: currently incarcerated  . Sexual activity: Not on file  Lifestyle  . Physical activity:    Days per week: Patient refused    Minutes per session: Patient refused  . Stress: Rather much  Relationships  . Social connections:    Talks on phone: Patient refused    Gets together: Patient refused    Attends religious service: Patient refused    Active member of club or organization: Patient refused    Attends meetings of clubs or organizations: Patient refused    Relationship status: Patient refused  Other Topics Concern  . Not on file  Social History Narrative  . Not on file    Hospital Course:  Pt was admitted to the [facility] OBS department for observation due to safety concerns. She had been acting bizarrely at home and the police were called. She was placed under IVC at [facility] and then sent to [facility] for medical clearance. She returned from [facility]  and was calm and cooperative overnight. She stated she does not know why the police were called. She denies mental health issues or any need for mental health medication. Pt denies drug and alcohol use but her UDS positive for cocaine and BAL negative. Pt stated she has a job at [employer] and needs to be at work by [employer]. She stated she lives alone. She also stated she has a twin and it was her twin that was doing all the things that caused the police to be called. Pt is psychiatrically clear  Physical Findings: AIMS: Facial and Oral Movements Muscles of Facial Expression: None, normal Lips and Perioral Area: None, normal Jaw:  None, normal Tongue: None, normal,Extremity Movements Upper (arms, wrists, hands, fingers): None, normal Lower (legs, knees, ankles, toes): None, normal, Trunk Movements Neck, shoulders, hips: None, normal, Overall Severity Severity of abnormal movements (highest score from questions above): None, normal Incapacitation due to abnormal movements: None, normal Patient's awareness of abnormal movements (rate only patient's report): No Awareness, Dental Status Current problems with teeth and/or dentures?: No Does patient usually wear dentures?: No  CIWA:   N/A COWS:   N/A  Musculoskeletal: Strength & Muscle Tone: within normal limits Gait & Station: normal Patient leans: N/A  Psychiatric Specialty Exam: Physical Exam  Constitutional: She appears well-developed and well-nourished.  Respiratory: Effort normal.  Musculoskeletal: Normal range of motion.  Psychiatric: Her behavior is normal. Judgment and thought content normal. Her mood appears anxious. Her speech is rapid and/or pressured. Cognition and memory are normal.    Review of Systems  Psychiatric/Behavioral: Positive for substance abuse. The patient is nervous/anxious.   All other systems reviewed and are negative.   Blood pressure (!) 90/48, temperature 98 F (36.7 C), temperature source Oral, resp. rate 18, SpO2 99 %.There is no height or weight on file to calculate BMI.  General Appearance: Casual  Eye Contact:  Good  Speech:  Clear and Coherent  Volume:  Normal  Mood:  Anxious  Affect:  Congruent  Thought Process:  Coherent, Goal Directed and Descriptions of Associations: Intact  Orientation:  Full (Time, Place, and Person)  Thought Content:  Logical  Suicidal Thoughts:  No  Homicidal Thoughts:  No  Memory:  Immediate;   Good Recent;   Fair Remote;   Fair  Judgement:  Fair  Insight:  Fair  Psychomotor Activity:  Normal  Concentration:  Concentration: Good and Attention Span: Good  Recall:  Good  Fund of  Knowledge:  Good  Language:  Good  Akathisia:  No  Handed:  Right  AIMS (if indicated):   N/A  Assets:  Architect Housing Physical Health Resilience Social Support Vocational/Educational  ADL's:  Intact  Cognition:  WNL  Sleep:   N/A        Has this patient used any form of tobacco in the last 30 days? (Cigarettes, Smokeless Tobacco, Cigars, and/or Pipes)  N/A  Blood Alcohol level:  Lab Results  Component Value Date   ETH <10 04/27/2019   ETH <10 03/03/2019    Metabolic Disorder Labs:  No results found for: HGBA1C, MPG No results found for: PROLACTIN No results found for: CHOL, TRIG, HDL, CHOLHDL, VLDL, LDLCALC  See Psychiatric Specialty Exam and Suicide Risk Assessment completed by Attending Physician prior to discharge.  Discharge destination:  Home  Is patient on multiple antipsychotic therapies at discharge:  No   Has Patient had three or more failed trials of antipsychotic monotherapy by history:  No  Recommended Plan for Multiple Antipsychotic Therapies: NA   Allergies as of 04/28/2019   No Known Allergies     Medication List    You have not been prescribed any medications.     Follow-up recommendations:  Activity:  as tolerated Diet:  Heart healthy  Treatment Plan and Disposition:  Take all medications as prescribed by your outpatient provider. Keep all follow-up appointments as scheduled.  Do not consume alcohol or use illegal drugs while on prescription medications. Report any adverse effects from your medications to your primary care provider promptly.  In the event of recurrent symptoms or worsening symptoms, call 911, a crisis hotline, or go to the nearest emergency department for evaluation.   Signed: Laveda AbbeLaurie Britton Parks, NP 04/28/2019, 10:56 AM   Patient seen face-to-face for psychiatric evaluation, chart reviewed and case discussed with the physician extender and developed treatment plan.  Reviewed the information documented and agree with the treatment plan.  Juanetta BeetsJacqueline Geovanni Rahming, DO 04/28/19 4:42 PM

## 2019-04-28 NOTE — Progress Notes (Signed)
Pt remains sleeping at present, no distress noted, calm at present. Q 15 min rounding in progress.

## 2019-04-28 NOTE — H&P (Signed)
BH Observation Unit Provider Admission PAA/H&P  Patient Identification: Melinda Velez MRN:  [id number] Date of Evaluation:  04/28/2019 Chief Complaint:  Schizoaffective Disorder, Bipolar Type Cocaine Use Disorder, Moderate Principal Diagnosis: <principal problem not specified> Diagnosis:  Active Problems:   MDD (major depressive disorder), severe (HCC)  History of Present Illness  Patient is a 33 year old female, single lives alone, no children, reports she works at a [employer]. She was brought to hospital via [employer] under commitment , which states   Respondent is under [facility] and is Schizophrenic. Does not take medication. Believes her support team is trying to abduct her. She is not sleeping or tending to personal hygiene. Believes [name] is her father. Increasingly agitated when asked about her behavior. Does not eat and says clothes are more important . Drinks alcohol and smokes marihuana .  Patient states she does not know why she is in the hospital.  States " I am fine". She states she has not been taking her medications because it was not well tolerated. Does not remember medication name . Denies any alcohol or drug abuse .  Patient presents with manic type symptoms- she is  Disorganized/ tangential  in thought process and pressured in speech. 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   Denies past psychiatric history, but states she had been prescribed a medication for depression which she has not been taking and does not remember name. Denies history of suicide attempts.  Chart review indicates patient was seen in October 2017 for an episode of head banging and cutting arms while incarcerated . At the time endorsed a history of past substance abuse . She was also seen in ED on 03/03/19 for agitated behavior and self inflicted lacerations .   Denies medical illnesses, denies pregnancy, denies medication allergies  Dx- Mania/  Unspecified .  Associated Signs/Symptoms: Depression Symptoms:  psychomotor agitation, (Hypo) Manic Symptoms:  Delusions, Anxiety Symptoms:  Excessive Worry, Psychotic Symptoms:  Delusions, PTSD Symptoms: NA Total Time spent with patient: 20 minutes  Past Psychiatric History:Substance abuse mood disorder  Is the patient at risk to self? Yes.    Has the patient been a risk to self in the past 6 months? Yes.    Has the patient been a risk to self within the distant past? Yes.    Is the patient a risk to others? No.  Has the patient been a risk to others in the past 6 months? No.  Has the patient been a risk to others within the distant past? No.   Prior Inpatient Therapy:   Prior Outpatient Therapy:    Alcohol Screening:   Substance Abuse History in the last 12 months:  Yes.   Consequences of Substance Abuse:  Previous Psychotropic Medications: Yes  Psychological Evaluations: Yes  Past Medical History: No past medical history on file. No past surgical history on file. Family History: No family history on file. Family Psychiatric History: unknown Tobacco Screening:   Social History:  Social History   Substance and Sexual Activity  Alcohol Use No     Social History   Substance and Sexual Activity  Drug Use Not on file   Comment: currently incarcerated    Additional Social History:                           Allergies:  No Known Allergies Lab Results:  Results for orders  placed or performed during the hospital encounter of 04/27/19 (from the past 48 hour(s))  Comprehensive metabolic panel     Status: None   Collection Time: 04/27/19  9:01 PM  Result Value Ref Range   Sodium 138 135 - 145 mmol/L   Potassium 3.9 3.5 - 5.1 mmol/L   Chloride 104 98 - 111 mmol/L   CO2 25 22 - 32 mmol/L   Glucose, Bld 94 70 - 99 mg/dL   BUN 12 6 - 20 mg/dL   Creatinine, Ser 2.08 0.44 - 1.00 mg/dL   Calcium 9.4 8.9 - 13.8 mg/dL   Total Protein 7.1 6.5 - 8.1 g/dL   Albumin  3.9 3.5 - 5.0 g/dL   AST 20 15 - 41 U/L   ALT 17 0 - 44 U/L   Alkaline Phosphatase 70 38 - 126 U/L   Total Bilirubin 0.6 0.3 - 1.2 mg/dL   GFR calc non Af Amer >60 >60 mL/min   GFR calc Af Amer >60 >60 mL/min   Anion gap 9 5 - 15    Comment: Performed at [facility], [address], [place], [place] [address]  Ethanol     Status: None   Collection Time: 04/27/19  9:01 PM  Result Value Ref Range   Alcohol, Ethyl (B) <10 <10 mg/dL    Comment: 974 (NOTE) Lowest detectable limit for serum alcohol is 10 mg/dL. For medical purposes only. Performed at [facility], [address] [place], [place] [address]   Salicylate level     Status: None   Collection Time: 04/27/19  9:01 PM  Result Value Ref Range   Salicylate Lvl <7.0 2.8 - 30.0 mg/dL    Comment: Performed at [facility], [address], [place], [place] [address]  Acetaminophen level     Status: Abnormal   Collection Time: 04/27/19  9:01 PM  Result Value Ref Range   Acetaminophen (Tylenol), Serum <10 (L) 10 - 30 ug/mL    Comment: (NOTE) Therapeutic concentrations vary significantly. A range of 10-30 ug/mL  may be an effective concentration for many patients. However, some  are best treated at concentrations outside of this range. Acetaminophen concentrations >150 ug/mL at 4 hours after ingestion  and >50 ug/mL at 12 hours after ingestion are often associated with  toxic reactions. Performed at [facility], [address], [place], [place] [address]   cbc     Status: None   Collection Time: 04/27/19  9:01 PM  Result Value Ref Range   WBC 6.7 4.0 - 10.5 K/uL   RBC 4.10 3.87 - 5.11 MIL/uL   Hemoglobin 12.4 12.0 - 15.0 g/dL   HCT 93.5 52.1 - 74.7 %   MCV 96.3 80.0 - 100.0 fL   MCH 30.2 26.0 - 34.0 pg   MCHC 31.4 30.0 - 36.0 g/dL   RDW 15.9 53.9 - 67.2 %   Platelets 230 150 - 400 K/uL   nRBC 0.0 0.0 - 0.2 %    Comment: Performed at [facility], [address], [place], [place] [address]   I-Stat beta hCG blood, ED     Status: None   Collection Time: 04/27/19  9:13 PM  Result Value Ref Range   I-stat hCG, quantitative <5.0 <5 mIU/mL   Comment 3            Comment:   GEST. AGE      CONC.  (mIU/mL)   <=1 WEEK  5 - 50     2 WEEKS       50 - 500     3 WEEKS       100 - 10,000     4 WEEKS     1,000 - 30,000        FEMALE AND NON-PREGNANT FEMALE:     LESS THAN 5 mIU/mL   TSH     Status: None   Collection Time: 04/27/19 10:04 PM  Result Value Ref Range   TSH 0.730 0.350 - 4.500 uIU/mL    Comment: Performed by a 3rd Generation assay with a functional sensitivity of <=0.01 uIU/mL. Performed at [facility], [address] [place], [place] [address]   Rapid urine drug screen (hospital performed)     Status: Abnormal   Collection Time: 04/27/19 10:16 PM  Result Value Ref Range   Opiates NONE DETECTED NONE DETECTED   Cocaine POSITIVE (A) NONE DETECTED   Benzodiazepines NONE DETECTED NONE DETECTED   Amphetamines NONE DETECTED NONE DETECTED   Tetrahydrocannabinol NONE DETECTED NONE DETECTED   Barbiturates NONE DETECTED NONE DETECTED    Comment: (NOTE) DRUG SCREEN FOR MEDICAL PURPOSES ONLY.  IF CONFIRMATION IS NEEDED FOR ANY PURPOSE, NOTIFY LAB WITHIN 5 DAYS. LOWEST DETECTABLE LIMITS FOR URINE DRUG SCREEN Drug Class                     Cutoff (ng/mL) Amphetamine and metabolites    1000 Barbiturate and metabolites    200 Benzodiazepine                 200 Tricyclics and metabolites     300 Opiates and metabolites        300 Cocaine and metabolites        300 THC                            50 Performed at [facility], [address] [place], [place] [address]     Blood Alcohol level:  Lab Results  Component Value Date   ETH <10 04/27/2019   ETH <10 03/03/2019    Metabolic Disorder Labs:  No results found for: HGBA1C, MPG No results found for: PROLACTIN No results found for: CHOL, TRIG, HDL, CHOLHDL, VLDL, LDLCALC  Current  Medications: Current Facility-Administered Medications  Medication Dose Route Frequency Provider Last Rate Last Dose  . acetaminophen (TYLENOL) tablet 650 mg  650 mg Oral Q6H PRN Kerry HoughSimon, La Dibella E, PA-C      . alum & mag hydroxide-simeth (MAALOX/MYLANTA) 200-200-20 MG/5ML suspension 30 mL  30 mL Oral Q4H PRN Kerry HoughSimon, Kiefer Opheim E, PA-C      . hydrOXYzine (ATARAX/VISTARIL) tablet 25 mg  25 mg Oral Q6H PRN Donell SievertSimon, Clemmie Buelna E, PA-C      . magnesium hydroxide (MILK OF MAGNESIA) suspension 30 mL  30 mL Oral Daily PRN Kerry HoughSimon, Vipul Cafarelli E, PA-C      . traZODone (DESYREL) tablet 50 mg  50 mg Oral QHS,MR X 1 Danial Hlavac E, PA-C       PTA Medications: No medications prior to admission.    Musculoskeletal: Strength & Muscle Tone: within normal limits Gait & Station: normal Patient leans: N/A  Psychiatric Specialty Exam: Physical Exam  Constitutional: She is oriented to person, place, and time. She appears well-developed and well-nourished. No distress.  Respiratory: Effort normal and breath sounds normal. No respiratory distress.  Neurological: She is alert  and oriented to person, place, and time. No cranial nerve deficit.  Skin: Skin is warm and dry. She is not diaphoretic.  Psychiatric: Her mood appears anxious. Her speech is rapid and/or pressured. She is agitated and withdrawn. Thought content is delusional. Cognition and memory are impaired. She expresses impulsivity. She expresses no homicidal and no suicidal ideation. She expresses no suicidal plans and no homicidal plans.    Review of Systems  Constitutional: Negative.  Negative for chills, diaphoresis, fever, malaise/fatigue and weight loss.  Psychiatric/Behavioral: Positive for hallucinations, memory loss and substance abuse. Negative for depression and suicidal ideas. The patient has insomnia.     Blood pressure (!) 90/48, temperature 98 F (36.7 C), temperature source Oral, resp. rate 18, SpO2 99 %.There is no height or weight on file to  calculate BMI.  General Appearance: Disheveled  Eye Contact:  Fair  Speech:  Pressured  Volume:  Increased  Mood:  Anxious  Affect:  Congruent  Thought Process:  Disorganized  Orientation:  Full (Time, Place, and Person)  Thought Content:  Delusions  Suicidal Thoughts:  No  Homicidal Thoughts:  No  Memory:  Immediate;   Poor  Judgement:  Impaired  Insight:  Lacking  Psychomotor Activity:  Increased  Concentration:  Concentration: Poor  Recall:  Poor  Fund of Knowledge:  Fair  Language:  Fair  Akathisia:  Negative  Handed:  Right  AIMS (if indicated):     Assets:  Desire for Improvement  ADL's:  Intact  Cognition:  WNL  Sleep:         Treatment Plan Summary: Daily contact with patient to assess and evaluate symptoms and progress in treatment  Observation Level/Precautions:  Continuous Observation Laboratory:  Chemistry Profile Psychotherapy:   Medications:   Consultations:   Discharge Concerns:   Estimated LOS: Other:      Kerry Hough, PA-C 4/30/202012:59 AM

## 2019-04-28 NOTE — Progress Notes (Signed)
Pt returns to [facility] Obs Unit, A&O x 3, gait steady, accompanied by [employer], pt is IVC in handcuffs, calm & cooperative at present.  Pt sent to [facility] ED for Medical Clearance.  Pt initally very anxious with pressures speech, stating she doesn't want [place] to kill her.  Pt returns calm & cooperative, no distress noted,

## 2019-04-28 NOTE — ED Notes (Signed)
All appropriate discharge materials reviewed at length with patient. Time for questions provided. Pt has no other questions at this time and verbalizes understanding of all provided materials.  

## 2019-07-13 ENCOUNTER — Inpatient Hospital Stay (HOSPITAL_COMMUNITY)
Admission: AD | Admit: 2019-07-13 | Discharge: 2019-07-18 | DRG: 885 | Disposition: A | Discharge status: Home / Self Care | Payer: Medicaid Other | Attending: Psychiatry | Admitting: Psychiatry

## 2019-07-13 DIAGNOSIS — F17200 Nicotine dependence, unspecified, uncomplicated: Secondary | ICD-10-CM | POA: Diagnosis present

## 2019-07-13 DIAGNOSIS — F200 Paranoid schizophrenia: Secondary | ICD-10-CM | POA: Diagnosis not present

## 2019-07-13 DIAGNOSIS — F250 Schizoaffective disorder, bipolar type: Principal | ICD-10-CM | POA: Diagnosis present

## 2019-07-13 DIAGNOSIS — Z1159 Encounter for screening for other viral diseases: Secondary | ICD-10-CM

## 2019-07-13 DIAGNOSIS — F22 Delusional disorders: Secondary | ICD-10-CM | POA: Diagnosis present

## 2019-07-13 DIAGNOSIS — F1410 Cocaine abuse, uncomplicated: Secondary | ICD-10-CM | POA: Diagnosis present

## 2019-07-13 DIAGNOSIS — F209 Schizophrenia, unspecified: Secondary | ICD-10-CM | POA: Diagnosis present

## 2019-07-13 LAB — SARS CORONAVIRUS 2 BY RT PCR (HOSPITAL ORDER, PERFORMED IN ~~LOC~~ HOSPITAL LAB): SARS Coronavirus 2: NEGATIVE

## 2019-07-13 MED ORDER — HALOPERIDOL LACTATE 5 MG/ML IJ SOLN
INTRAMUSCULAR | Status: AC
  Administered 2019-07-13: 17:00:00 10 mg via INTRAMUSCULAR
  Filled 2019-07-13: qty 2

## 2019-07-13 MED ORDER — LORAZEPAM 1 MG PO TABS
4.0000 mg | ORAL_TABLET | Freq: Four times a day (QID) | ORAL | Status: DC | PRN
  Administered 2019-07-15 – 2019-07-16 (×2): 4 mg via ORAL
  Filled 2019-07-13 (×2): qty 4

## 2019-07-13 MED ORDER — DIPHENHYDRAMINE HCL 50 MG/ML IJ SOLN
25.0000 mg | Freq: Four times a day (QID) | INTRAMUSCULAR | Status: DC | PRN
  Administered 2019-07-13: 17:00:00 25 mg via INTRAMUSCULAR

## 2019-07-13 MED ORDER — ACETAMINOPHEN 325 MG PO TABS
650.0000 mg | ORAL_TABLET | Freq: Four times a day (QID) | ORAL | Status: DC | PRN
  Administered 2019-07-17: 19:00:00 650 mg via ORAL
  Filled 2019-07-13: qty 2

## 2019-07-13 MED ORDER — LORAZEPAM 2 MG/ML IJ SOLN
INTRAMUSCULAR | Status: AC
  Administered 2019-07-13: 17:00:00 2 mg via INTRAMUSCULAR
  Filled 2019-07-13: qty 1

## 2019-07-13 MED ORDER — DIPHENHYDRAMINE HCL 50 MG/ML IJ SOLN
INTRAMUSCULAR | Status: AC
  Administered 2019-07-13: 17:00:00 25 mg via INTRAMUSCULAR
  Filled 2019-07-13: qty 1

## 2019-07-13 MED ORDER — ALUM & MAG HYDROXIDE-SIMETH 200-200-20 MG/5ML PO SUSP: 30.0000 mL | ORAL | Status: DC | PRN

## 2019-07-13 MED ORDER — LORAZEPAM 1 MG PO TABS
2.0000 mg | ORAL_TABLET | ORAL | Status: DC | PRN
  Administered 2019-07-14 – 2019-07-17 (×2): 2 mg via ORAL
  Filled 2019-07-13 (×2): qty 2

## 2019-07-13 MED ORDER — LORAZEPAM 2 MG/ML IJ SOLN
2.0000 mg | INTRAMUSCULAR | Status: DC | PRN
  Administered 2019-07-13: 17:00:00 2 mg via INTRAMUSCULAR

## 2019-07-13 MED ORDER — ZIPRASIDONE MESYLATE 20 MG IM SOLR
20.0000 mg | Freq: Two times a day (BID) | INTRAMUSCULAR | Status: DC | PRN
  Filled 2019-07-13: qty 20

## 2019-07-13 MED ORDER — HALOPERIDOL LACTATE 5 MG/ML IJ SOLN
10.0000 mg | Freq: Four times a day (QID) | INTRAMUSCULAR | Status: DC | PRN
  Administered 2019-07-13: 17:00:00 10 mg via INTRAMUSCULAR

## 2019-07-13 MED ORDER — LORAZEPAM 2 MG/ML IJ SOLN
4.0000 mg | Freq: Four times a day (QID) | INTRAMUSCULAR | Status: DC | PRN
  Filled 2019-07-13: qty 2

## 2019-07-13 MED ORDER — HALOPERIDOL 5 MG PO TABS
10.0000 mg | ORAL_TABLET | Freq: Four times a day (QID) | ORAL | Status: DC | PRN
  Administered 2019-07-14: 14:00:00 10 mg via ORAL
  Filled 2019-07-13: qty 2

## 2019-07-13 MED ORDER — DIPHENHYDRAMINE HCL 25 MG PO CAPS
25.0000 mg | ORAL_CAPSULE | Freq: Four times a day (QID) | ORAL | Status: DC | PRN
  Administered 2019-07-16: 17:00:00 25 mg via ORAL
  Filled 2019-07-13: qty 1

## 2019-07-13 NOTE — Progress Notes (Signed)
Tried to get pt from the observation unit. Pt could not be aroused to walk down to the 500 hall. Pt was left in the observation unit until pt was stable to have a skin performed and  search her clothes . Per report pt was given IMs earlier today.

## 2019-07-13 NOTE — BH Assessment (Signed)
Assessment Note  Melinda Velez is an 33 y.o. female. Pt denies SI/HI and AVH. Pt was IVCd due to not taking her medication for Schizophrenia. Pt had difficulty completing the assessment. Pt had thought blocking, Pt had flight of ideas. Pt has an ACTT team with [facility]. Pt was a poor historian. Pt could not report would caused the IVC or any significant information about her diagnosis or treatment for Schizophrenia. Pt reports previous inpatient treatment but could not recall when or where. Pt denies SA.  Tinnie Gens, NP recommends inpatient treatment.   Diagnosis:  F20.9 Schizophrenia  Past Medical History: No past medical history on file.  No past surgical history on file.  Family History: No family history on file.  Social History:  reports that she has been smoking. She has never used smokeless tobacco. She reports that she does not drink alcohol. No history on file for drug.  Additional Social History:  Alcohol / Drug Use Pain Medications: please see mar Prescriptions: please see mar Over the Counter: please see mar History of alcohol / drug use?: No history of alcohol / drug abuse Longest period of sobriety (when/how long): NA  CIWA: CIWA-Ar BP: 119/78 Pulse Rate: 79 COWS:    Allergies: No Known Allergies  Home Medications:  No medications prior to admission.    OB/GYN Status:  No LMP recorded.  General Assessment [facility] Assessment: In system Is this a Tele or Face-to-Face Assessment?: Face-to-Face Is this an Initial Assessment or a Re-assessment for this encounter?: Initial Assessment Patient Accompanied by:: N/A Language Other than English: No Living Arrangements: Other (Comment) What gender do you identify as?: Female Marital status: Single Maiden name: NA Pregnancy Status: No Living Arrangements: Alone Can pt return to current living arrangement?: Yes Admission Status: Voluntary Is patient capable of signing  voluntary admission?: Yes Referral Source: Self/Family/Friend Insurance type: Medicaid  Medical Screening Exam ([place]) Medical Exam completed: Yes  Crisis Care Plan Living Arrangements: Alone Legal Guardian: Other:(self) Name of Psychiatrist: NA Name of Therapist: NA  Education Status Is patient currently in school?: No Is the patient employed, unemployed or receiving disability?: Employed  Risk to self with the past 6 months Suicidal Ideation: No Has patient been a risk to self within the past 6 months prior to admission? : No Suicidal Intent: No Has patient had any suicidal intent within the past 6 months prior to admission? : No Is patient at risk for suicide?: No Suicidal Plan?: No Has patient had any suicidal plan within the past 6 months prior to admission? : No Access to Means: No What has been your use of drugs/alcohol within the last 12 months?: NA Previous Attempts/Gestures: No How many times?: 0 Other Self Harm Risks: NA Triggers for Past Attempts: None known Intentional Self Injurious Behavior: None Family Suicide History: No Recent stressful life event(s): Conflict (Comment) Persecutory voices/beliefs?: No Depression: Yes Depression Symptoms: Fatigue, Feeling angry/irritable Substance abuse history and/or treatment for substance abuse?: No Suicide prevention information given to non-admitted patients: Not applicable  Risk to Others within the past 6 months Homicidal Ideation: No Does patient have any lifetime risk of violence toward others beyond the six months prior to admission? : No Thoughts of Harm to Others: No Current Homicidal Intent: No Current Homicidal Plan: No Access to Homicidal Means: No Identified Victim: NA History of harm to others?: No Assessment of Violence: None Noted Violent Behavior Description: NA Does patient have access to weapons?: No  Criminal Charges Pending?: Yes Describe Pending Criminal Charges: unknown Does  patient have a court date: Yes Is patient on probation?: No  Psychosis Hallucinations: None noted Delusions: None noted  Mental Status Report Appearance/Hygiene: Unremarkable Eye Contact: Fair Motor Activity: Freedom of movement Speech: Rapid, Pressured Level of Consciousness: Alert Mood: Anxious Affect: Anxious Anxiety Level: Moderate Thought Processes: Circumstantial, Tangential, Flight of Ideas, Thought Blocking Judgement: Impaired Orientation: Person, Place Obsessive Compulsive Thoughts/Behaviors: None  Cognitive Functioning Concentration: Decreased Memory: Recent Impaired, Remote Impaired Is patient IDD: No Insight: Poor Impulse Control: Poor Appetite: Fair Have you had any weight changes? : No Change Sleep: No Change Total Hours of Sleep: 8 Vegetative Symptoms: None  ADLScreening [facility] Assessment Services) Patient's cognitive ability adequate to safely complete daily activities?: No Patient able to express need for assistance with ADLs?: Yes Independently performs ADLs?: Yes (appropriate for developmental age)  Prior Inpatient Therapy Prior Inpatient Therapy: Yes Prior Therapy Dates: unknown Prior Therapy Facilty/Provider(s): unknown Reason for Treatment: Schizophrenia  Prior Outpatient Therapy Prior Outpatient Therapy: Yes Prior Therapy Dates: current  Prior Therapy Facilty/Provider(s): [facility] Reason for Treatment: Schizophrenia Does patient have an ACCT team?: No Does patient have Intensive In-House Services?  : No Does patient have [facility] services? : No Does patient have P4CC services?: No  ADL Screening (condition at time of admission) Patient's cognitive ability adequate to safely complete daily activities?: No Is the patient deaf or have difficulty hearing?: Yes Does the patient have difficulty seeing, even when wearing glasses/contacts?: No Does the patient have difficulty concentrating, remembering, or making decisions?: No Patient able to express  need for assistance with ADLs?: Yes Does the patient have difficulty dressing or bathing?: No Independently performs ADLs?: Yes (appropriate for developmental age)       Abuse/Neglect Assessment (Assessment to be complete while patient is alone) Abuse/Neglect Assessment Can Be Completed: Yes Physical Abuse: Denies Verbal Abuse: Denies Sexual Abuse: Denies Exploitation of patient/patient's resources: Denies     Merchant navy officerAdvance Directives (For Healthcare) Does Patient Have a Medical Advance Directive?: No Would patient like information on creating a medical advance directive?: No - Patient declined          Disposition:  Disposition Initial Assessment Completed for this Encounter: Yes Disposition of Patient: Admit  On Site Evaluation by:   Reviewed with Physician:    Emmit PomfretLevette,Issak Goley D 07/13/2019 3:21 PM

## 2019-07-13 NOTE — H&P (Signed)
Behavioral Health Medical Screening Exam  Melinda Velez is an 33 y.o. female.who presents as a walk-in to [facility] under IVC. Patient was transported to the hospital  by police. When asked about her reason for coming to the hospital  she replied, " my ACT team had me committed." She presents with manic type symptoms; tangential and disorganized thought process and pressured speech. It is difficult to obtain information as patient rambles about many different things unrelated to questions asked. At so points during the assessment, their appears to be some thought blocking.  Patient has a history of delusional state. During this evaluation, patient pulls up her shirt, point to her stomach and states, "somebody changed It around." Patient then states, " all people work for [employer] and I live in a hotel with some of the people. Thank you for putting God in my life" This is then followed by her stating her DOB and social security number. Per report, patient has not been taking her psychotropic medications. She denies active or passive SI or HI. Per review of chart, patient was seen at behavioral health on 4/29/2020admitted to the observation unit then discharged 04/28/2019. Patient denies substance abuse or use.    Total Time spent with patient: 15 minutes  Psychiatric Specialty Exam: Physical Exam  Nursing note reviewed. Constitutional: She is oriented to person, place, and time.  Neurological: She is alert and oriented to person, place, and time.    Review of Systems  Psychiatric/Behavioral:       Thought blocking, disorganized thoughts     Blood pressure 119/78, pulse 79, temperature 98.2 F (36.8 C), temperature source Oral, resp. rate 18, SpO2 100 %.There is no height or weight on file to calculate BMI.  General Appearance: Fairly Groomed  Eye Contact:  Fair  Speech:  Pressured  Volume:  variable   Mood:  " I am fine I just need to leave."  Affect:  Labile  Thought Process:  Disorganized  and Descriptions of Associations: Tangential  Orientation:  Full (Time, Place, and Person)  Thought Content:  denies hallucinations   Suicidal Thoughts:  No  Homicidal Thoughts:  No  Memory:  Immediate;   Fair Recent;   Fair  Judgement:  Impaired  Insight:  limited  Psychomotor Activity:  Normal  Concentration: Concentration: Fair and Attention Span: Fair  Recall:  [employer] of Knowledge:Fair  Language: Good  Akathisia:  Negative  Handed:  Right  AIMS (if indicated):     Assets:  Desire for Improvement Resilience  Sleep:       Musculoskeletal: Strength & Muscle Tone: within normal limits Gait & Station: normal Patient leans: N/A  Blood pressure 119/78, pulse 79, temperature 98.2 F (36.8 C), temperature source Oral, resp. rate 18, SpO2 100 %.  Recommendations:  Based on my evaluation the patient does not appear to have an emergency medical condition.  Patient meets inpatient psychiatric hospitalization   Mordecai Maes, NP 07/13/2019, 2:55 PM

## 2019-07-14 ENCOUNTER — Encounter (HOSPITAL_COMMUNITY): Payer: Self-pay

## 2019-07-14 ENCOUNTER — Other Ambulatory Visit: Payer: Self-pay

## 2019-07-14 DIAGNOSIS — F200 Paranoid schizophrenia: Secondary | ICD-10-CM

## 2019-07-14 MED ORDER — RISPERIDONE 3 MG PO TABS
3.0000 mg | ORAL_TABLET | Freq: Two times a day (BID) | ORAL | Status: DC
  Administered 2019-07-14 – 2019-07-18 (×8): 3 mg via ORAL
  Filled 2019-07-14 (×10): qty 1

## 2019-07-14 MED ORDER — BENZTROPINE MESYLATE 1 MG PO TABS
1.0000 mg | ORAL_TABLET | Freq: Two times a day (BID) | ORAL | Status: DC
  Administered 2019-07-14 – 2019-07-18 (×8): 1 mg via ORAL
  Filled 2019-07-14 (×10): qty 1

## 2019-07-14 MED ORDER — TEMAZEPAM 30 MG PO CAPS
30.0000 mg | ORAL_CAPSULE | Freq: Every day | ORAL | Status: DC
  Administered 2019-07-14 – 2019-07-17 (×2): 30 mg via ORAL
  Filled 2019-07-14 (×3): qty 1

## 2019-07-14 NOTE — Progress Notes (Addendum)
Pt currently lying in bed with eyes closed, respirations even/unlabored, snoring loudly, no s/s of distress (a) 15 min checks (r) safety maintained.

## 2019-07-14 NOTE — Progress Notes (Signed)
Recreation Therapy Notes  INPATIENT RECREATION THERAPY ASSESSMENT  Patient Details Name: Melinda Velez MRN: [id number] DOB: 02-06-1986 Today's Date: 07/14/2019       Information Obtained From: Patient  Able to Participate in Assessment/Interview: Yes  Patient Presentation: (Drowsy)  Reason for Admission (Per Patient): Other (Comments)(Pt stated her ACTT team brought her.)  Patient Stressors: Other (Comment)(Didn't have anybody; Didn't have check)  Coping Skills:   Music, Exercise, Deep Breathing, Impulsivity, Avoidance  Leisure Interests (2+):  Sports - Football, Individual - Other (Comment)(Better self; workout)  Frequency of Recreation/Participation: Monthly  Awareness of Community Resources:  Yes  Community Resources:  [place], Patent examiner  Current Use: Yes  If no, Barriers?:    Expressed Interest in [place]: No  [employer] of Residence:  [place]  Patient Main Form of Transportation: Walk  Patient Strengths:  Tell somebody never fear nothing; If you need me, call me  Patient Identified Areas of Improvement:  Attitude  Patient Goal for Hospitalization:  "prove I'm okay and ready to go home"  Current SI (including self-harm):  No  Current HI:  No  Current AVH: No  Staff Intervention Plan: Group Attendance, Collaborate with Interdisciplinary Treatment Team  Consent to Intern Participation: N/A    Victorino Sparrow, LRT/CTRS   Victorino Sparrow A 07/14/2019, 12:14 PM

## 2019-07-14 NOTE — Progress Notes (Signed)
Pt easily awaken to get labs drawn. Pt refused labs this am, would not get out of bed, and kept repeating "she didn't want to be here." Pt fell back asleep quickly. Respirations even/unlabored, safety maintained.
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
## 2019-07-14 NOTE — BHH Suicide Risk Assessment (Signed)
[facility] Admission Suicide Risk Assessment   Nursing information obtained from:  Patient Demographic factors:  Adolescent or young adult, Caucasian Current Mental Status:  NA Loss Factors:  NA Historical Factors:  NA Risk Reduction Factors:  NA  Total Time spent with patient: 45 Principal Problem: <principal problem not specified> Diagnosis:  Active Problems:   Schizophrenia (HCC)  Subjective Data: Volatile and hallucinating on presentation  Continued Clinical Symptoms:  Alcohol Use Disorder Identification Test Final Score (AUDIT): 0 The "Alcohol Use Disorders Identification Test", Guidelines for Use in Primary Care, Second Edition.  World Pharmacologist [facility]). Score between 0-7:  no or low risk or alcohol related problems. Score between 8-15:  moderate risk of alcohol related problems. Score between 16-19:  high risk of alcohol related problems. Score 20 or above:  warrants further diagnostic evaluation for alcohol dependence and treatment.   CLINICAL FACTORS:   Alcohol/Substance Abuse/Dependencies   Musculoskeletal: Strength & Muscle Tone: within normal limits Gait & Station: normal Patient leans: N/A  Psychiatric Specialty Exam: Physical Exam vital stable  ROS neurological denies head trauma or seizures/cardiovascular denies issues/neurological denies head trauma or seizures  Blood pressure 119/78, pulse 79, temperature 98.2 F (36.8 C), temperature source Oral, resp. rate 18, height 5\' 5"  (1.651 m), weight 54.4 kg, SpO2 100 %.Body mass index is 19.97 kg/m.  General Appearance: Disheveled  Eye Contact:  None  Speech:  Slow and Slurred  Volume:  Decreased  Mood:  Dysphoric  Affect:  Congruent  Thought Process:  Irrelevant and Descriptions of Associations: Loose  Orientation:  Other:  Person place general situation  Thought Content:  Illogical and Delusions  Suicidal Thoughts:  No  Homicidal Thoughts:  No  Memory:  Immediate;   Poor  Judgement:  Impaired   Insight:  Shallow  Psychomotor Activity:  Decreased  Concentration:  Concentration: Poor  Recall:  Poor  Fund of Knowledge:  Poor  Language:  Poor  Akathisia:  Negative  Handed:  Right  AIMS (if indicated):     Assets:  Resilience Social Support  ADL's:  Intact  Cognition:  WNL  Sleep:     COGNITIVE FEATURES THAT CONTRIBUTE TO RISK:  Loss of executive function    SUICIDE RISK:   Mild:  Suicidal ideation of limited frequency, intensity, duration, and specificity.  There are no identifiable plans, no associated intent, mild dysphoria and related symptoms, good self-control (both objective and subjective assessment), few other risk factors, and identifiable protective factors, including available and accessible social support. Though no specific suicidal thoughts plans or intent patient is at risk of self-harm due to her level of disorganization and psychosis PLAN OF CARE: see eval  I certify that inpatient services furnished can reasonably be expected to improve the patient's condition.   Johnn Hai, MD 07/14/2019, 1:52 PM

## 2019-07-14 NOTE — BH Assessment (Signed)
Pt approached stating she is feeling extremely paranoid '' I need medication. i'm not feeling safe, please don't leave me behind. '' pt reguesting prn medication, prn given as ordered.

## 2019-07-14 NOTE — H&P (Signed)
Psychiatric Admission Assessment Adult  Patient Identification: Melinda Velez MRN:  [id number] Date of Evaluation:  07/14/2019 Chief Complaint:  schizoaffective disorder bipolar type Principal Diagnosis: Exacerbation of underlying psychotic disorder complicated by cocaine abuse Diagnosis:  Active Problems:   Schizophrenia (HCC)  History of Present Illness:   This is the latest of multiple admissions and psychiatric/behavioral health encounters for this 33 year old single patient who is normally [facility] by [facility], reports that she is compliant with her long-acting injectable aripiprazole, but who presented on 7/15 in a disorganized/psychotic state of mind, rambling various delusional statements, poor compliance and agitation, requiring IM medications.  She had a drug screen positive for cocaine but will not elaborate on her usage.  As discussed in the notes of 7/15 she displayed thought blocking, disorganized thought and behavior, and when she did ramble it was about delusional beliefs.  At this point she denies auditory or visual hallucinations, at times irritable stating "people are judging her" and that she "is not a bad person" uses some profanity, rambles is sluggish makes no eye contact. She does however deny current hallucinations and denies thoughts of harming self or others is oriented to person place general situation not day date or time. States she is now living in hotels  Has received IM Benadryl Ativan and Haldol. Previously expressed delusional material including believing her mother was chopped up in her freezer at the hospital and that people are trying to rape her and she was actively hallucinating as recently as 930 this morning.  Last admission and observation was in March of this year, medications were resumed, she did stabilize once again then she was bizarre disorganized but also abusing cocaine.   Associated Signs/Symptoms: Depression Symptoms:   hypersomnia, psychomotor agitation, (Hypo) Manic Symptoms:  Labiality of Mood, Anxiety Symptoms:  n/a Psychotic Symptoms:  Delusions, Hallucinations: Auditory PTSD Symptoms: NA Total Time spent with patient: 45 minutes  Past Psychiatric History: exstensive  Is the patient at risk to self? Yes.    Has the patient been a risk to self in the past 6 months? Yes.    Has the patient been a risk to self within the distant past? Yes.    Is the patient a risk to others? Yes.    Has the patient been a risk to others in the past 6 months? Yes.    Has the patient been a risk to others within the distant past? Yes.     Prior Inpatient Therapy: Prior Inpatient Therapy: Yes Prior Therapy Dates: unknown Prior Therapy Facilty/Provider(s): unknown Reason for Treatment: Schizophrenia Prior Outpatient Therapy: Prior Outpatient Therapy: Yes Prior Therapy Dates: current  Prior Therapy Facilty/Provider(s): [facility] Reason for Treatment: Schizophrenia Does patient have an ACCT team?: No Does patient have Intensive In-House Services?  : No Does patient have [facility] services? : No Does patient have [facility] services?: No  Alcohol Screening: 1. How often do you have a drink containing alcohol?: Never 2. How many drinks containing alcohol do you have on a typical day when you are drinking?: 1 or 2 3. How often do you have six or more drinks on one occasion?: Never AUDIT-C Score: 0 4. How often during the last year have you found that you were not able to stop drinking once you had started?: Never 5. How often during the last year have you failed to do what was normally expected from you becasue of drinking?: Never 6. How often during the last year have you needed a first drink in the  morning to get yourself going after a heavy drinking session?: Never 7. How often during the last year have you had a feeling of guilt of remorse after drinking?: Never 8. How often during the last year have you been unable to  remember what happened the night before because you had been drinking?: Never 9. Have you or someone else been injured as a result of your drinking?: No 10. Has a relative or friend or a doctor or another health worker been concerned about your drinking or suggested you cut down?: No Alcohol Use Disorder Identification Test Final Score (AUDIT): 0 Substance Abuse History in the last 12 months:  Yes.   Consequences of Substance Abuse: NA Previous Psychotropic Medications: Yes  Psychological Evaluations: No  Past Medical History: History reviewed. No pertinent past medical history. History reviewed. No pertinent surgical history. Family History: History reviewed. No pertinent family history. Family Psychiatric  History: ukn Tobacco Screening:   Social History:  Social History   Substance and Sexual Activity  Alcohol Use No     Social History   Substance and Sexual Activity  Drug Use Not on file   Comment: currently incarcerated    Additional Social History: Marital status: Single    Pain Medications: please see mar Prescriptions: please see mar Over the Counter: please see mar History of alcohol / drug use?: No history of alcohol / drug abuse Longest period of sobriety (when/how long): NA                    Allergies:  No Known Allergies Lab Results:  Results for orders placed or performed during the hospital encounter of 07/13/19 (from the past 48 hour(s))  SARS Coronavirus 2 (CEPHEID - Performed [facility] lab), Hosp Order     Status: None   Collection Time: 07/13/19  4:57 PM   Specimen: Nasopharyngeal Swab  Result Value Ref Range   SARS Coronavirus 2 NEGATIVE NEGATIVE    Comment: (NOTE) If result is NEGATIVE SARS-CoV-2 target nucleic acids are NOT DETECTED. The SARS-CoV-2 RNA is generally detectable in upper and lower  respiratory specimens during the acute phase of infection. The lowest  concentration of SARS-CoV-2 viral copies this assay can  detect is 250  copies / mL. A negative result does not preclude SARS-CoV-2 infection  and should not be used as the sole basis for treatment or other  patient management decisions.  A negative result may occur with  improper specimen collection / handling, submission of specimen other  than nasopharyngeal swab, presence of viral mutation(s) within the  areas targeted by this assay, and inadequate number of viral copies  (<250 copies / mL). A negative result must be combined with clinical  observations, patient history, and epidemiological information. If result is POSITIVE SARS-CoV-2 target nucleic acids are DETECTED. The SARS-CoV-2 RNA is generally detectable in upper and lower  respiratory specimens dur ing the acute phase of infection.  Positive  results are indicative of active infection with SARS-CoV-2.  Clinical  correlation with patient history and other diagnostic information is  necessary to determine patient infection status.  Positive results do  not rule out bacterial infection or co-infection with other viruses. If result is PRESUMPTIVE POSTIVE SARS-CoV-2 nucleic acids MAY BE PRESENT.   A presumptive positive result was obtained on the submitted specimen  and confirmed on repeat testing.  While 2019 novel coronavirus  (SARS-CoV-2) nucleic acids may be present in the submitted sample  additional confirmatory testing may  be necessary for epidemiological  and / or clinical management purposes  to differentiate between  SARS-CoV-2 and other Sarbecovirus currently known to infect humans.  If clinically indicated additional testing with an alternate test  methodology [phone]) is advised. The SARS-CoV-2 RNA is generally  detectable in upper and lower respiratory sp ecimens during the acute  phase of infection. The expected result is Negative. Fact Sheet for Patients:  StrictlyIdeas.no Fact Sheet for Healthcare  Providers: BankingDealers.co.za This test is not yet approved or cleared by the [place] FDA and has been authorized for detection and/or diagnosis of SARS-CoV-2 by FDA under an Emergency Use Authorization (EUA).  This EUA will remain in effect (meaning this test can be used) for the duration of the COVID-19 declaration under Section 564(b)(1) of the Act, 21 U.S.C. section 360bbb-3(b)(1), unless the authorization is terminated or revoked sooner. Performed at [facility], [place] [address], [place], [place] [address]     Blood Alcohol level:  Lab Results  Component Value Date   ETH <10 04/27/2019   ETH <10 86/76/7209    Metabolic Disorder Labs:  No results found for: HGBA1C, MPG No results found for: PROLACTIN No results found for: CHOL, TRIG, HDL, CHOLHDL, VLDL, LDLCALC  Current Medications: Current Facility-Administered Medications  Medication Dose Route Frequency Provider Last Rate Last Dose  . acetaminophen (TYLENOL) tablet 650 mg  650 mg Oral Q6H PRN Mordecai Maes, NP      . alum & mag hydroxide-simeth (MAALOX/MYLANTA) 200-200-20 MG/5ML suspension 30 mL  30 mL Oral Q4H PRN Mordecai Maes, NP      . benztropine (COGENTIN) tablet 1 mg  1 mg Oral BID Johnn Hai, MD      . diphenhydrAMINE (BENADRYL) capsule 25 mg  25 mg Oral Q6H PRN Johnn Hai, MD       Or  . diphenhydrAMINE (BENADRYL) injection 25 mg  25 mg Intramuscular Q6H PRN Johnn Hai, MD   25 mg at 07/13/19 1630  . haloperidol (HALDOL) tablet 10 mg  10 mg Oral Q6H PRN Johnn Hai, MD       Or  . haloperidol lactate (HALDOL) injection 10 mg  10 mg Intramuscular Q6H PRN Johnn Hai, MD   10 mg at 07/13/19 1630  . LORazepam (ATIVAN) tablet 2 mg  2 mg Oral Q4H PRN Johnn Hai, MD       Or  . LORazepam (ATIVAN) injection 2 mg  2 mg Intramuscular Q4H PRN Johnn Hai, MD   2 mg at 07/13/19 1630  . LORazepam (ATIVAN) tablet 4 mg  4 mg Oral Q6H PRN Johnn Hai, MD        Or  . LORazepam (ATIVAN) injection 4 mg  4 mg Intramuscular Q6H PRN Johnn Hai, MD      . risperiDONE (RISPERDAL) tablet 3 mg  3 mg Oral BID Johnn Hai, MD      . temazepam (RESTORIL) capsule 30 mg  30 mg Oral QHS Johnn Hai, MD      . ziprasidone (GEODON) injection 20 mg  20 mg Intramuscular Q12H PRN Johnn Hai, MD       PTA Medications: Medications Prior to Admission  Medication Sig Dispense Refill Last Dose  . ABILIFY MAINTENA 400 MG PRSY prefilled syringe Inject 400 mg as directed every 30 (thirty) days.     . ARIPiprazole (ABILIFY) 10 MG tablet Take 10 mg by mouth daily.     . divalproex (DEPAKOTE ER) 500 MG 24 hr tablet Take 500 mg by  mouth 2 (two) times a day.     . lithium carbonate (ESKALITH) 450 MG CR tablet Take 450 mg by mouth 2 (two) times a day.       Musculoskeletal: Strength & Muscle Tone: within normal limits Gait & Station: normal Patient leans: N/A  Psychiatric Specialty Exam: Physical Exam vital stable  ROS neurological denies head trauma or seizures/cardiovascular denies issues/neurological denies head trauma or seizures  Blood pressure 119/78, pulse 79, temperature 98.2 F (36.8 C), temperature source Oral, resp. rate 18, height 5\' 5"  (1.651 m), weight 54.4 kg, SpO2 100 %.Body mass index is 19.97 kg/m.  General Appearance: Disheveled  Eye Contact:  None  Speech:  Slow and Slurred  Volume:  Decreased  Mood:  Dysphoric  Affect:  Congruent  Thought Process:  Irrelevant and Descriptions of Associations: Loose  Orientation:  Other:  Person place general situation  Thought Content:  Illogical and Delusions  Suicidal Thoughts:  No  Homicidal Thoughts:  No  Memory:  Immediate;   Poor  Judgement:  Impaired  Insight:  Shallow  Psychomotor Activity:  Decreased  Concentration:  Concentration: Poor  Recall:  Poor  Fund of Knowledge:  Poor  Language:  Poor  Akathisia:  Negative  Handed:  Right  AIMS (if indicated):     Assets:  Resilience Social  Support  ADL's:  Intact  Cognition:  WNL  Sleep:       Treatment Plan Summary: Daily contact with patient to assess and evaluate symptoms and progress in treatment, Medication management and Plan Continue current precautions begin oral antipsychotic therapy to supplement her long-acting injectable  Observation Level/Precautions:  15 minute checks  Laboratory:  UDS  Psychotherapy: Cognitive-based/reality-based  Medications: Long-acting injectable reportedly was given 3 days ago we will try to confirm  Consultations: None necessary  Discharge Concerns: Longer-term sobriety and stability  Estimated LOS: 5-7  Other: Exacerbation of underlying psychotic disorder complicated by cocaine abuse   Physician Treatment Plan for Primary Diagnosis: Require stabilization Long Term Goal(s): Improvement in symptoms so as ready for discharge  Short Term Goals: Ability to disclose and discuss suicidal ideas, Ability to demonstrate self-control will improve and Ability to identify and develop effective coping behaviors will improve  Physician Treatment Plan for Secondary Diagnosis: Active Problems:   Schizophrenia (HCC)  Long Term Goal(s): Improvement in symptoms so as ready for discharge  Short Term Goals: Ability to identify and develop effective coping behaviors will improve, Ability to maintain clinical measurements within normal limits will improve and Compliance with prescribed medications will improve  I certify that inpatient services furnished can reasonably be expected to improve the patient's condition.    Malvin JohnsFARAH,Consetta Cosner, MD 7/16/20201:45 PM

## 2019-07-14 NOTE — [facility] Counselor (Signed)
CSW attempted to do assessment with patient, but patient could barely stay awake during the assessment. CSW asked if the patient could stay awake or do the assessment on the following day. Pt stated that she was sleepy and asked to do so the following day.

## 2019-07-14 NOTE — Progress Notes (Signed)
D: Pt has been sleep most of the evening.  A: Pt was offered support and encouragement.  Pt was encourage to attend groups. Q 15 minute checks were done for safety.  R: safety maintained on unit.

## 2019-07-15 NOTE — Progress Notes (Signed)
Recreation Therapy Notes  Date: 7.17.20 Time: 1000 Location: 500 Hall Dayroom  Group Topic:  Goal Setting  Goal Area(s) Addresses:  Patient will be able to identify at least 3 life goals.  Patient will be able to identify benefit of investing in life goals.  Patient will be able to identify benefit of setting life goals.   Intervention: Worksheet, pencils  Activity: Goal Planning.  Patients were to set goals they wish to accomplish in Velez week, month, year and 5 years.  Pt were to also identify any obstacles they could face, what they need to accomplish their goals and what they can start doing now to work towards their goals.  Education:  Discharge Planning, Coping Skills, Leisure Education   Education Outcome: Acknowledges Education/In Group Clarification Provided/Needs Additional Education  Clinical Observations:  Pt did not attend group.     Melinda Velez, LRT/CTRS        Melinda Velez 07/15/2019 11:10 AM 

## 2019-07-15 NOTE — Progress Notes (Addendum)
Patient ID: EMMY KENG, female   DOB: 08/10/1986, 33 y.o.   MRN: [id number]   CSW contacted pt's care coordinator, Santiago Glad. Pt's care coordinator stated that she has [facility] (but does not stay here) and she has [facility] (Team LeadLonn [place] [phone]).   Pt's team lead for [facility], Kayla contacted CSW to discuss the patient. Patient has had constant readmissions into hospitals. [facility] received her in February 2020. She has been in jail twice since February due to [employer]. She will do dangerous actions. She sold her fridge (the apartment's fridge) and furniture for drug money. Lonn [place] states that she is not medication compliant. Kayla wanted to speak to the doctor about medication changes and asked about long-term placement for the patient. CSW informed team lead that CSW has not met with patient at this time, but will be soon. CSW transferred team lead to the doctor.

## 2019-07-15 NOTE — Progress Notes (Signed)
Patient ID: Melinda Velez, female   DOB: 08/16/1986, 33 y.o.   MRN: [id number]   [facility] NOVEL CORONAVIRUS (COVID-19) DAILY CHECK-OFF SYMPTOMS - answer yes or no to each - every day NO YES  Have you had a fever in the past 24 hours?  . Fever (Temp > 37.80C / 100F) X   Have you had any of these symptoms in the past 24 hours? . New Cough .  Sore Throat  .  Shortness of Breath .  Difficulty Breathing .  Unexplained Body Aches   X   Have you had any one of these symptoms in the past 24 hours not related to allergies?   . Runny Nose .  Nasal Congestion .  Sneezing   X   If you have had runny nose, nasal congestion, sneezing in the past 24 hours, has it worsened?  X   EXPOSURES - check yes or no X   Have you traveled outside the state in the past 14 days?  X   Have you been in contact with someone with a confirmed diagnosis of COVID-19 or PUI in the past 14 days without wearing appropriate PPE?  X   Have you been living in the same home as a person with confirmed diagnosis of COVID-19 or a PUI (household contact)?    X   Have you been diagnosed with COVID-19?    X              What to do next: Answered NO to all: Answered YES to anything:   Proceed with unit schedule Follow the [facility] Inpatient Flowsheet.   

## 2019-07-15 NOTE — Tx Team (Signed)
Interdisciplinary Treatment and Diagnostic Plan Update  07/15/2019 Time of Session: 09:02am Melinda Velez MRN: [id number]  Principal Diagnosis: <principal problem not specified>  Secondary Diagnoses: Active Problems:   Schizophrenia (HCC)   Current Medications:  Current Facility-Administered Medications  Medication Dose Route Frequency Provider Last Rate Last Dose  . acetaminophen (TYLENOL) tablet 650 mg  650 mg Oral Q6H PRN Denzil Magnusonhomas, Lashunda, NP      . alum & mag hydroxide-simeth (MAALOX/MYLANTA) 200-200-20 MG/5ML suspension 30 mL  30 mL Oral Q4H PRN Denzil Magnusonhomas, Lashunda, NP      . benztropine (COGENTIN) tablet 1 mg  1 mg Oral BID Malvin JohnsFarah, Brian, MD   1 mg at 07/15/19 1124  . diphenhydrAMINE (BENADRYL) capsule 25 mg  25 mg Oral Q6H PRN Malvin JohnsFarah, Brian, MD       Or  . diphenhydrAMINE (BENADRYL) injection 25 mg  25 mg Intramuscular Q6H PRN Malvin JohnsFarah, Brian, MD   25 mg at 07/13/19 1630  . haloperidol (HALDOL) tablet 10 mg  10 mg Oral Q6H PRN Malvin JohnsFarah, Brian, MD   10 mg at 07/14/19 1423   Or  . haloperidol lactate (HALDOL) injection 10 mg  10 mg Intramuscular Q6H PRN Malvin JohnsFarah, Brian, MD   10 mg at 07/13/19 1630  . LORazepam (ATIVAN) tablet 2 mg  2 mg Oral Q4H PRN Malvin JohnsFarah, Brian, MD   2 mg at 07/14/19 1422   Or  . LORazepam (ATIVAN) injection 2 mg  2 mg Intramuscular Q4H PRN Malvin JohnsFarah, Brian, MD   2 mg at 07/13/19 1630  . LORazepam (ATIVAN) tablet 4 mg  4 mg Oral Q6H PRN Malvin JohnsFarah, Brian, MD       Or  . LORazepam (ATIVAN) injection 4 mg  4 mg Intramuscular Q6H PRN Malvin JohnsFarah, Brian, MD      . risperiDONE (RISPERDAL) tablet 3 mg  3 mg Oral BID Malvin JohnsFarah, Brian, MD   3 mg at 07/15/19 1124  . temazepam (RESTORIL) capsule 30 mg  30 mg Oral QHS Malvin JohnsFarah, Brian, MD   30 mg at 07/14/19 2204  . ziprasidone (GEODON) injection 20 mg  20 mg Intramuscular Q12H PRN Malvin JohnsFarah, Brian, MD       PTA Medications: Medications Prior to Admission  Medication Sig Dispense Refill Last Dose  . ABILIFY MAINTENA 400 MG PRSY prefilled syringe Inject 400  mg as directed every 30 (thirty) days.     . ARIPiprazole (ABILIFY) 10 MG tablet Take 10 mg by mouth daily.     . divalproex (DEPAKOTE ER) 500 MG 24 hr tablet Take 500 mg by mouth 2 (two) times a day.     . lithium carbonate (ESKALITH) 450 MG CR tablet Take 450 mg by mouth 2 (two) times a day.       Patient Stressors:    Patient Strengths:    Treatment Modalities: Medication Management, Group therapy, Case management,  1 to 1 session with clinician, Psychoeducation, Recreational therapy.   Physician Treatment Plan for Primary Diagnosis: <principal problem not specified> Long Term Goal(s): Improvement in symptoms so as ready for discharge Improvement in symptoms so as ready for discharge   Short Term Goals: Ability to disclose and discuss suicidal ideas Ability to demonstrate self-control will improve Ability to identify and develop effective coping behaviors will improve Ability to identify and develop effective coping behaviors will improve Ability to maintain clinical measurements within normal limits will improve Compliance with prescribed medications will improve  Medication Management: Evaluate patient's response, side effects, and tolerance of medication regimen.  Therapeutic Interventions:  1 to 1 sessions, Unit Group sessions and Medication administration.  Evaluation of Outcomes: Progressing  Physician Treatment Plan for Secondary Diagnosis: Active Problems:   Schizophrenia ([place])  Long Term Goal(s): Improvement in symptoms so as ready for discharge Improvement in symptoms so as ready for discharge   Short Term Goals: Ability to disclose and discuss suicidal ideas Ability to demonstrate self-control will improve Ability to identify and develop effective coping behaviors will improve Ability to identify and develop effective coping behaviors will improve Ability to maintain clinical measurements within normal limits will improve Compliance with prescribed medications  will improve     Medication Management: Evaluate patient's response, side effects, and tolerance of medication regimen.  Therapeutic Interventions: 1 to 1 sessions, Unit Group sessions and Medication administration.  Evaluation of Outcomes: Progressing   RN Treatment Plan for Primary Diagnosis: <principal problem not specified> Long Term Goal(s): Knowledge of disease and therapeutic regimen to maintain health will improve  Short Term Goals: Ability to participate in decision making will improve, Ability to verbalize feelings will improve, Ability to disclose and discuss suicidal ideas, Ability to identify and develop effective coping behaviors will improve and Compliance with prescribed medications will improve  Medication Management: RN will administer medications as ordered by provider, will assess and evaluate patient's response and provide education to patient for prescribed medication. RN will report any adverse and/or side effects to prescribing provider.  Therapeutic Interventions: 1 on 1 counseling sessions, Psychoeducation, Medication administration, Evaluate responses to treatment, Monitor vital signs and CBGs as ordered, Perform/monitor CIWA, COWS, AIMS and Fall Risk screenings as ordered, Perform wound care treatments as ordered.  Evaluation of Outcomes: Progressing   LCSW Treatment Plan for Primary Diagnosis: <principal problem not specified> Long Term Goal(s): Safe transition to appropriate next level of care at discharge, Engage patient in therapeutic group addressing interpersonal concerns.  Short Term Goals: Engage patient in aftercare planning with referrals and resources and Increase skills for wellness and recovery  Therapeutic Interventions: Assess for all discharge needs, 1 to 1 time with Social worker, Explore available resources and support systems, Assess for adequacy in community support network, Educate family and significant other(s) on suicide prevention,  Complete Psychosocial Assessment, Interpersonal group therapy.  Evaluation of Outcomes: Progressing   Progress in Treatment: Attending groups: No. Participating in groups: No. Taking medication as prescribed: Yes. Toleration medication: Yes. Family/Significant other contact made: No, will contact:  will contact if given consent to contact Patient understands diagnosis: Yes. Discussing patient identified problems/goals with staff: Yes. Medical problems stabilized or resolved: Yes. Denies suicidal/homicidal ideation: Yes. Issues/concerns per patient self-inventory: No. Other:   New problem(s) identified: No, Describe:  None  New Short Term/Long Term Goal(s): Medication stabilization, elimination of SI thoughts, and development of a comprehensive mental wellness plan.   Patient Goals:    Discharge Plan or Barriers: CSW will continue to follow up for appropriate referrals and possible discharge planning. Pt does have an active ACTT team and [facility] housing.   Reason for Continuation of Hospitalization: Medication stabilization  Estimated Length of Stay: 2-3 days  Attendees: Patient: 07/15/2019   Physician: Dr. Johnn Hai, MD 07/15/2019   Nursing: Elberta Fortis, RN 07/15/2019   RN Care Manager: 07/15/2019   Social Worker: Ardelle Anton, LCSW 07/15/2019  Recreational Therapist:  07/15/2019   Other:  07/15/2019  Other:  07/15/2019   Other: 07/15/2019      Scribe for Treatment Team: Trecia Rogers, LCSW 07/15/2019 12:54 PM

## 2019-07-15 NOTE — Progress Notes (Signed)
[place] NOVEL CORONAVIRUS (COVID-19) DAILY CHECK-OFF SYMPTOMS - answer yes or no to each - every day NO YES  Have you had a fever in the past 24 hours?  . Fever (Temp > 37.80C / 100F) X   Have you had any of these symptoms in the past 24 hours? . New Cough .  Sore Throat  .  Shortness of Breath .  Difficulty Breathing .  Unexplained Body Aches   X   Have you had any one of these symptoms in the past 24 hours not related to allergies?   . Runny Nose .  Nasal Congestion .  Sneezing   X   If you have had runny nose, nasal congestion, sneezing in the past 24 hours, has it worsened?  X   EXPOSURES - check yes or no X   Have you traveled outside the state in the past 14 days?  X   Have you been in contact with someone with a confirmed diagnosis of COVID-19 or PUI in the past 14 days without wearing appropriate PPE?  X   Have you been living in the same home as a person with confirmed diagnosis of COVID-19 or a PUI (household contact)?    X   Have you been diagnosed with COVID-19?    X              What to do next: Answered NO to all: Answered YES to anything:   Proceed with unit schedule Follow the [facility] Inpatient Flowsheet.   

## 2019-07-15 NOTE — Progress Notes (Signed)
[facility]  07/15/2019 8:04 AM Melinda Velez Melinda Velez  MRN:  [id number] Subjective:    This is the latest of multiple admissions and psychiatric/behavioral health encounters for this 33 year old single patient who is normally followed by Melinda Velez, reports that she is compliant with her long-acting injectable aripiprazole, but who presented on 7/15 in a disorganized/psychotic state of mind, rambling various delusional statements, poor compliance and agitation, requiring IM medications.  She had a drug screen positive for cocaine but will not elaborate on her usage.  As discussed in the notes of 7/15 she displayed thought blocking, disorganized thought and behavior, and when she did ramble it was about delusional beliefs.  At this point she denies auditory or visual hallucinations, at times irritable stating "people are judging her" and that she "is not a bad person" uses some profanity, rambles is sluggish makes no eye contact. She does however deny current hallucinations and denies thoughts of harming self or others is oriented to person place general situation not day date or time. States she is now living in hotels  On today's exam, the patient is alert she is a little sluggish she is oriented to person place time day and situation and is very focused on discharge.  She denies any current auditory or visual hallucinations she denies wanting to harm herself or anyone else but again very focused on discharge.  She reports she is current with her long-acting injectable aripiprazole. She reports good sleep good appetite and has little recall of her volatility and psychotic symptoms at this point in time. She denies cocaine cravings, she denies the need for any sort of rehab following discharge.  Principal Problem: Exacerbation of underlying psychotic disorder complicated by cocaine abuse Diagnosis: Active Problems:   Schizophrenia (HCC)  Total Time spent with patient: 20 minutes  Past  Psychiatric History: exstensive  Past Medical History: History reviewed. No pertinent past medical history. History reviewed. No pertinent surgical history. Family History: History reviewed. No pertinent family history. Family Psychiatric  History: neg Social History:  Social History   Substance and Sexual Activity  Alcohol Use No     Social History   Substance and Sexual Activity  Drug Use Not on file   Comment: currently incarcerated    Social History   Socioeconomic History  . Marital status: Single    Spouse name: Not on file  . Number of children: Not on file  . Years of education: Not on file  . Highest education level: Not on file  Occupational History  . Not on file  Social Needs  . Financial resource strain: Not on file  . Food insecurity    Worry: Not on file    Inability: Not on file  . Transportation needs    Medical: Patient refused    Non-medical: Patient refused  Tobacco Use  . Smoking status: Current Every Day Smoker  . Smokeless tobacco: Never Used  Substance and Sexual Activity  . Alcohol use: No  . Drug use: Not on file    Comment: currently incarcerated  . Sexual activity: Not on file  Lifestyle  . Physical activity    Days per week: Patient refused    Minutes per session: Patient refused  . Stress: Rather much  Relationships  . Social Musicianconnections    Talks on phone: Patient refused    Gets together: Patient refused    Attends religious service: Patient refused    Active member of club or organization: Patient refused  Attends meetings of clubs or organizations: Patient refused    Relationship status: Patient refused  Other Topics Concern  . Not on file  Social History Narrative  . Not on file   Additional Social History:    Pain Medications: please see mar Prescriptions: please see mar Over the Counter: please see mar History of alcohol / drug use?: No history of alcohol / drug abuse Longest period of sobriety (when/how long):  NA                    Sleep: Fair  Appetite:  Fair  Current Medications: Current Facility-Administered Medications  Medication Dose Route Frequency Provider Last Rate Last Dose  . acetaminophen (TYLENOL) tablet 650 mg  650 mg Oral Q6H PRN Denzil Magnusonhomas, Lashunda, NP      . alum & mag hydroxide-simeth (MAALOX/MYLANTA) 200-200-20 MG/5ML suspension 30 mL  30 mL Oral Q4H PRN Denzil Magnusonhomas, Lashunda, NP      . benztropine (COGENTIN) tablet 1 mg  1 mg Oral BID Malvin JohnsFarah, Blannie Shedlock, MD   1 mg at 07/14/19 2203  . diphenhydrAMINE (BENADRYL) capsule 25 mg  25 mg Oral Q6H PRN Malvin JohnsFarah, Tazia Illescas, MD       Or  . diphenhydrAMINE (BENADRYL) injection 25 mg  25 mg Intramuscular Q6H PRN Malvin JohnsFarah, Aeson Sawyers, MD   25 mg at 07/13/19 1630  . haloperidol (HALDOL) tablet 10 mg  10 mg Oral Q6H PRN Malvin JohnsFarah, Cale Bethard, MD   10 mg at 07/14/19 1423   Or  . haloperidol lactate (HALDOL) injection 10 mg  10 mg Intramuscular Q6H PRN Malvin JohnsFarah, Capitola Ladson, MD   10 mg at 07/13/19 1630  . LORazepam (ATIVAN) tablet 2 mg  2 mg Oral Q4H PRN Malvin JohnsFarah, Kori Goins, MD   2 mg at 07/14/19 1422   Or  . LORazepam (ATIVAN) injection 2 mg  2 mg Intramuscular Q4H PRN Malvin JohnsFarah, Tawanda Schall, MD   2 mg at 07/13/19 1630  . LORazepam (ATIVAN) tablet 4 mg  4 mg Oral Q6H PRN Malvin JohnsFarah, Kei Langhorst, MD       Or  . LORazepam (ATIVAN) injection 4 mg  4 mg Intramuscular Q6H PRN Malvin JohnsFarah, Nur Krasinski, MD      . risperiDONE (RISPERDAL) tablet 3 mg  3 mg Oral BID Malvin JohnsFarah, Kanen Mottola, MD   3 mg at 07/14/19 2203  . temazepam (RESTORIL) capsule 30 mg  30 mg Oral QHS Malvin JohnsFarah, Anwar Crill, MD   30 mg at 07/14/19 2204  . ziprasidone (GEODON) injection 20 mg  20 mg Intramuscular Q12H PRN Malvin JohnsFarah, Keywon Mestre, MD        Lab Results:  Results for orders placed or performed during the hospital encounter of 07/13/19 (from the past 48 hour(s))  SARS Coronavirus 2 (CEPHEID - [facility]), Hosp Order     Status: None   Collection Time: 07/13/19  4:57 PM   Specimen: Nasopharyngeal Swab  Result Value Ref Range   SARS Coronavirus 2  NEGATIVE NEGATIVE    Comment: (NOTE) If result is NEGATIVE SARS-CoV-2 target nucleic acids are NOT DETECTED. The SARS-CoV-2 RNA is generally detectable in upper and lower  respiratory specimens during the acute phase of infection. The lowest  concentration of SARS-CoV-2 viral copies this assay can detect is 250  copies / mL. A negative result does not preclude SARS-CoV-2 infection  and should not be used as the sole basis for treatment or other  patient management decisions.  A negative result may occur with  improper specimen collection / handling, submission of  specimen other  than nasopharyngeal swab, presence of viral mutation(s) within the  areas targeted by this assay, and inadequate number of viral copies  (<250 copies / mL). A negative result must be combined with clinical  observations, patient history, and epidemiological information. If result is POSITIVE SARS-CoV-2 target nucleic acids are DETECTED. The SARS-CoV-2 RNA is generally detectable in upper and lower  respiratory specimens dur ing the acute phase of infection.  Positive  results are indicative of active infection with SARS-CoV-2.  Clinical  correlation with patient history and other diagnostic information is  necessary to determine patient infection status.  Positive results do  not rule out bacterial infection or co-infection with other viruses. If result is PRESUMPTIVE POSTIVE SARS-CoV-2 nucleic acids MAY BE PRESENT.   A presumptive positive result was obtained on the submitted specimen  and confirmed on repeat testing.  While 2019 novel coronavirus  (SARS-CoV-2) nucleic acids may be present in the submitted sample  additional confirmatory testing may be necessary for epidemiological  and / or clinical management purposes  to differentiate between  SARS-CoV-2 and other Sarbecovirus currently known to infect humans.  If clinically indicated additional testing with an alternate test  methodology [id number]) is  advised. The SARS-CoV-2 RNA is generally  detectable in upper and lower respiratory sp ecimens during the acute  phase of infection. The expected result is Negative. Fact Sheet for Patients:  StrictlyIdeas.no Fact Sheet for Healthcare Providers: BankingDealers.co.za This test is not yet approved or cleared by the [place] FDA and has been authorized for detection and/or diagnosis of SARS-CoV-2 by FDA under an Emergency Use Authorization (EUA).  This EUA will remain in effect (meaning this test can be used) for the duration of the COVID-19 declaration under Section 564(b)(1) of the Act, 21 U.S.C. section 360bbb-3(b)(1), unless the authorization is terminated or revoked sooner. Performed at [facility], [place] [address], [place], [place] [address]     Blood Alcohol level:  Lab Results  Component Value Date   ETH <10 04/27/2019   ETH <10 09/62/8366    Metabolic Disorder Labs: No results found for: HGBA1C, MPG No results found for: PROLACTIN No results found for: CHOL, TRIG, HDL, CHOLHDL, VLDL, LDLCALC  Physical Findings: AIMS:  , ,  ,  ,    CIWA:    COWS:     Musculoskeletal: Strength & Muscle Tone: within normal limits Gait & Station: normal Patient leans: N/A  Psychiatric Specialty Exam: Physical Exam  ROS  Blood pressure 119/78, pulse 79, temperature 98.2 F (36.8 C), temperature source Oral, resp. rate 18, height 5\' 5"  (1.651 m), weight 54.4 kg, SpO2 100 %.Body mass index is 19.97 kg/m.  General Appearance: Casual  Eye Contact:  Minimal  Speech:  Slow  Volume:  Decreased  Mood:  Dysphoric  Affect:  Flat  Thought Process:  Goal Directed and Descriptions of Associations: Circumstantial  Orientation:  Full (Time, Place, and Person)  Thought Content:  Logical and Tangential  Suicidal Thoughts:  No  Homicidal Thoughts:  No  Memory:  Immediate;   Fair  Judgement:  Fair  Insight:  Fair   Psychomotor Activity:  Normal  Concentration:  Concentration: Fair  Recall:  [employer] of Knowledge:  Fair  Language:  Fair  Akathisia:  Negative  Handed:  Right  AIMS (if indicated):     Assets:  Physical Health Resilience Social Support  ADL's:  Intact  Cognition:  WNL  Sleep:  Number of Hours: 6.75  Treatment Plan Summary: Daily contact with patient to assess and evaluate symptoms and progress in treatment, Medication management and Plan Continue to monitor under current precautions due to the severity of her presenting pathology should monitor through the weekend and continue oral supplementation of long-acting injectable, further will be wise to go ahead and check with Melinda Velez to see that she is indeed current with her long-acting injectable aripiprazole prior to discharge.  No change in precautions or therapies  Malvin JohnsFARAH,Taquan Bralley, MD 07/15/2019, 8:04 AM

## 2019-07-15 NOTE — Plan of Care (Signed)
D: Pt alert and oriented on the unit. Pt denies SI/HI, A/VH. Pt's affect was flat with an irritated mood. Pt argued about wanting to see the doctor and talking loudly in her room.   A: Education, support and encouragement provided, q15 minute safety checks remain in effect. Medications administered per MD orders. R: No reactions/side effects to medicine noted. Pt denies any concerns at this time, and verbally contracts for safety. Pt ambulating on the unit with no issues. Pt remains safe on the unit.   Problem: Activity: Goal: Interest or engagement in activities will improve Outcome: Not Progressing   Problem: Coping: Goal: Ability to verbalize frustrations and anger appropriately will improve Outcome: Not Progressing

## 2019-07-15 NOTE — [facility] Counselor (Signed)
Adult Comprehensive Assessment  Patient ID: Melinda Mooreiffany D Velez, female   DOB: 03-18-1986, 33 y.o.   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 Family Relationships: Pt reports that most of her family is deceased Surveyor, quantityinancial / Lack of resources (include bankruptcy): Pt reports that she still has not received her stimulus checks. Housing / Lack of housing: Pt reports that she is staying at a hotel. [facility] will put her in an apartment. Physical health (include injuries & life threatening diseases): Pt denies stressors Social relationships: Pt denies stressors Substance abuse: Pt reports that she used to smoke crack/cocaine but reports that she does not anymore. Bereavement / Loss: Pt denies stressors. Pt does report the lost of her mother.  Living/Environment/Situation:  Living Arrangements: Alone Living conditions (as described by patient or guardian): Pt reports living in a hotel until she can get somewhere else Who else lives in the home?: Alone How long has patient lived in current situation?: 4 days What is atmosphere in current home: Temporary  Family History:  Marital status: Widowed Widowed, when?: 2007 Are you sexually active?: No What is your sexual orientation?: Heterosexual Has your sexual activity been affected by drugs, alcohol, medication, or emotional stress?: No Does patient have children?: No  Childhood History:  By whom was/is the patient raised?: Both parents Description of patient's relationship with caregiver when they were a child: "They were the best" Patient's description of current  relationship with people who raised him/her: Pt's mother is deceased. Pt's father is deceased. How were you disciplined when you got in trouble as a child/adolescent?: "I was never disciplined." Does patient have siblings?: Yes Number of Siblings: 3(2 brothers and 1 sister) Description of patient's current relationship with siblings: "One brother is deceased. Other siblings is alright" Did patient suffer any verbal/emotional/physical/sexual abuse as a child?: No Did patient suffer from severe childhood neglect?: No Has patient ever been sexually abused/assaulted/raped as an adolescent or adult?: No Was the patient ever a victim of a crime or a disaster?: No Witnessed domestic violence?: No Has patient been effected by domestic violence as an adult?: No  Education:  Highest grade of school patient has completed: 12th grade; GED Currently a student?: No Learning disability?: No  Employment/Work Situation:   Employment situation: Unemployed Patient's job has been impacted by current illness: No What is the longest time patient has a held a job?: 2 months Where was the patient employed at that time?: [employer] Did You Receive Any Psychiatric Treatment/Services While in the [place] [employer] No Are There Guns or Other Weapons in Your Home?: No  Financial Resources:   Surveyor, quantityinancial resources: [employer], Receives SSI Does patient have a Lawyerrepresentative payee or guardian?: No  Alcohol/Substance Abuse:   What has been your use of drugs/alcohol within the last 12 months?: Pt reports smoking marijuana and crack/cocaine. Pt reports that she is done with all of it. If attempted suicide, did drugs/alcohol play a role in this?: No Alcohol/Substance Abuse Treatment Hx: Denies past history Has alcohol/substance abuse ever caused legal problems?: No  Social Support System:   Patient's Community Support System: None Describe Community Support System: Pt reports that she does  not have support systems. Type  of faith/religion: Pt denies having any faith or religion How does patient's faith help to cope with current illness?: N/A  Leisure/Recreation:   Leisure and Hobbies: "I like to do a lot for fun"  Strengths/Needs:   What is the patient's perception of their strengths?: Sports, using my mind to make things happen, and making other people rich Patient states they can use these personal strengths during their treatment to contribute to their recovery: "I want some pamphlets to help me relax and some resources" Patient states these barriers may affect/interfere with their treatment: N/A Patient states these barriers may affect their return to the community: N/A Other important information patient would like considered in planning for their treatment: N/A  Discharge Plan:   Currently receiving community mental health services: Yes (From Whom)([facility]) Patient states concerns and preferences for aftercare planning are: [facility] Patient states they will know when they are safe and ready for discharge when: "Right now" Does patient have access to transportation?: Yes(ACTT) Does patient have financial barriers related to discharge medications?: No Will patient be returning to same living situation after discharge?: Yes(hotel until apartment is ready)  Summary/Recommendations:   Summary and Recommendations (to be completed by the evaluator): Patient is a 33 year old female who is normally followed by [facility], reports that she is compliant with her long-acting injectable aripiprazole, but who presented on 7/15 in a disorganized/psychotic state of mind, rambling various delusional statements, poor compliance and agitation, requiring IM medications. Pt's diagnosis is: Schizophrenia ([place]). Recommendations for patient include: crisis stabilization, therapeutic milieu, medication management, attend and participate in group therapy, and development of a comprehensive mental wellness plan.  Trecia Rogers.  07/15/2019

## 2019-07-15 NOTE — Progress Notes (Signed)
Pt has been asleep since shift change. Respirations even and unlabored. No distress noted. Will access needs when Pt wakes up. Safety maintained.

## 2019-07-16 MED ORDER — DIVALPROEX SODIUM 500 MG PO DR TAB
500.0000 mg | DELAYED_RELEASE_TABLET | Freq: Two times a day (BID) | ORAL | Status: DC
  Administered 2019-07-16 – 2019-07-18 (×4): 500 mg via ORAL
  Filled 2019-07-16 (×8): qty 1

## 2019-07-16 NOTE — Progress Notes (Signed)
[facility]  07/16/2019 11:31 AM Melinda Velez  MRN:  [id number] Subjective: Patient is a 33 year old female with a past psychiatric history significant for schizoaffective disorder versus bipolar disorder and cocaine use disorder who was admitted on 07/14/2019 after being involuntarily committed by her ACT team.  Objective: Patient is seen and examined.  Patient is a 33 year old female with the above-stated past psychiatric history who was admitted on 07/14/2019 secondary to manic symptoms, tangential and disorganized thinking.  She has had multiple psychiatric admissions in the past, and reported at least on admission that she had been compliant with her long-acting Abilify injection.  She was apparently significantly agitated on admission and required intramuscular medications.  She had received intramuscular Benadryl, Ativan as well as Haldol.  She was considered delusional.  During her evaluation today she is pressured, but does not appear acutely psychotic.  Review of the electronic medical record revealed that she had been previously treated with lithium as well as Depakote.  She denied any suicidal or homicidal ideation.  Her vital signs are stable, she is afebrile.  She slept 5.75 hours last night.  Her current medications include Cogentin, PRN Haldol, as needed lorazepam, Risperdal 3 mg p.o. twice daily, and temazepam 30 mg p.o. nightly.  She denied suicidal or homicidal ideation.  She denied any gross psychotic symptoms currently.  She has apparently refused multiple laboratories, and the only thing in the chart is her negative test for coronavirus.  Principal Problem: <principal problem not specified> Diagnosis: Active Problems:   Schizophrenia ([facility])  Total Time spent with patient: 20 minutes  Past Psychiatric History: See admission H&P  Past Medical History: History reviewed. No pertinent past medical history. History reviewed. No pertinent surgical history. Family History:  History reviewed. No pertinent family history. Family Psychiatric  History: See admission H&P Social History:  Social History   Substance and Sexual Activity  Alcohol Use No     Social History   Substance and Sexual Activity  Drug Use Not on file   Comment: currently incarcerated    Social History   Socioeconomic History  . Marital status: Single    Spouse name: Not on file  . Number of children: Not on file  . Years of education: Not on file  . Highest education level: Not on file  Occupational History  . Not on file  Social Needs  . Financial resource strain: Not on file  . Food insecurity    Worry: Not on file    Inability: Not on file  . Transportation needs    Medical: Patient refused    Non-medical: Patient refused  Tobacco Use  . Smoking status: Current Every Day Smoker  . Smokeless tobacco: Never Used  Substance and Sexual Activity  . Alcohol use: No  . Drug use: Not on file    Comment: currently incarcerated  . Sexual activity: Not on file  Lifestyle  . Physical activity    Days per week: Patient refused    Minutes per session: Patient refused  . Stress: Rather much  Relationships  . Social Musicianconnections    Talks on phone: Patient refused    Gets together: Patient refused    Attends religious service: Patient refused    Active member of club or organization: Patient refused    Attends meetings of clubs or organizations: Patient refused    Relationship status: Patient refused  Other Topics Concern  . Not on file  Social History Narrative  . Not on  file   Additional Social History:    Pain Medications: please see mar Prescriptions: please see mar Over the Counter: please see mar History of alcohol / drug use?: No history of alcohol / drug abuse Longest period of sobriety (when/how long): NA                    Sleep: Fair  Appetite:  Fair  Current Medications: Current Facility-Administered Medications  Medication Dose Route  Frequency Provider Last Rate Last Dose  . acetaminophen (TYLENOL) tablet 650 mg  650 mg Oral Q6H PRN Denzil Magnusonhomas, Lashunda, NP      . alum & mag hydroxide-simeth (MAALOX/MYLANTA) 200-200-20 MG/5ML suspension 30 mL  30 mL Oral Q4H PRN Denzil Magnusonhomas, Lashunda, NP      . benztropine (COGENTIN) tablet 1 mg  1 mg Oral BID Malvin JohnsFarah, Brian, MD   1 mg at 07/16/19 0753  . diphenhydrAMINE (BENADRYL) capsule 25 mg  25 mg Oral Q6H PRN Malvin JohnsFarah, Brian, MD       Or  . diphenhydrAMINE (BENADRYL) injection 25 mg  25 mg Intramuscular Q6H PRN Malvin JohnsFarah, Brian, MD   25 mg at 07/13/19 1630  . haloperidol (HALDOL) tablet 10 mg  10 mg Oral Q6H PRN Malvin JohnsFarah, Brian, MD   10 mg at 07/14/19 1423   Or  . haloperidol lactate (HALDOL) injection 10 mg  10 mg Intramuscular Q6H PRN Malvin JohnsFarah, Brian, MD   10 mg at 07/13/19 1630  . LORazepam (ATIVAN) tablet 2 mg  2 mg Oral Q4H PRN Malvin JohnsFarah, Brian, MD   2 mg at 07/14/19 1422   Or  . LORazepam (ATIVAN) injection 2 mg  2 mg Intramuscular Q4H PRN Malvin JohnsFarah, Brian, MD   2 mg at 07/13/19 1630  . LORazepam (ATIVAN) tablet 4 mg  4 mg Oral Q6H PRN Malvin JohnsFarah, Brian, MD   4 mg at 07/15/19 1549   Or  . LORazepam (ATIVAN) injection 4 mg  4 mg Intramuscular Q6H PRN Malvin JohnsFarah, Brian, MD      . risperiDONE (RISPERDAL) tablet 3 mg  3 mg Oral BID Malvin JohnsFarah, Brian, MD   3 mg at 07/16/19 0753  . temazepam (RESTORIL) capsule 30 mg  30 mg Oral QHS Malvin JohnsFarah, Brian, MD   30 mg at 07/14/19 2204  . ziprasidone (GEODON) injection 20 mg  20 mg Intramuscular Q12H PRN Malvin JohnsFarah, Brian, MD        Lab Results: No results found for this or any previous visit (from the past 48 hour(s)).  Blood Alcohol level:  Lab Results  Component Value Date   ETH <10 04/27/2019   ETH <10 03/03/2019    Metabolic Disorder Labs: No results found for: HGBA1C, MPG No results found for: PROLACTIN No results found for: CHOL, TRIG, HDL, CHOLHDL, VLDL, LDLCALC  Physical Findings: AIMS: Facial and Oral Movements Muscles of Facial Expression: None, normal Lips and Perioral  Area: None, normal Jaw: None, normal Tongue: None, normal,Extremity Movements Upper (arms, wrists, hands, fingers): None, normal Lower (legs, knees, ankles, toes): None, normal, Trunk Movements Neck, shoulders, hips: None, normal, Overall Severity Severity of abnormal movements (highest score from questions above): None, normal Incapacitation due to abnormal movements: None, normal Patient's awareness of abnormal movements (rate only patient's report): No Awareness, Dental Status Current problems with teeth and/or dentures?: No Does patient usually wear dentures?: No  CIWA:    COWS:     Musculoskeletal: Strength & Muscle Tone: within normal limits Gait & Station: normal Patient leans: N/A  Psychiatric Specialty Exam: Physical  Exam  Nursing note and vitals reviewed. Constitutional: She is oriented to person, place, and time. She appears well-developed and well-nourished.  HENT:  Head: Normocephalic and atraumatic.  Respiratory: Effort normal.  Neurological: She is alert and oriented to person, place, and time.    ROS  Blood pressure 111/74, pulse 98, temperature 98.2 F (36.8 C), temperature source Oral, resp. rate 18, height 5\' 5"  (1.651 m), weight 54.4 kg, SpO2 100 %.Body mass index is 19.97 kg/m.  General Appearance: Casual  Eye Contact:  Fair  Speech:  Pressured  Volume:  Normal  Mood:  Anxious  Affect:  Congruent  Thought Process:  Coherent and Descriptions of Associations: Tangential  Orientation:  Full (Time, Place, and Person)  Thought Content:  Logical and Tangential  Suicidal Thoughts:  No  Homicidal Thoughts:  No  Memory:  Immediate;   Fair Recent;   Fair Remote;   Fair  Judgement:  Intact  Insight:  Lacking  Psychomotor Activity:  Increased  Concentration:  Concentration: Fair and Attention Span: Fair  Recall:  [employer] of Knowledge:  Fair  Language:  Good  Akathisia:  Negative  Handed:  Right  AIMS (if indicated):     Assets:  Desire for  Improvement Resilience  ADL's:  Intact  Cognition:  WNL  Sleep:  Number of Hours: 5.75     Treatment Plan Summary: Daily contact with patient to assess and evaluate symptoms and progress in treatment, Medication management and Plan : Patient is seen and examined.  Patient is a 33 year old female with the above-stated past psychiatric history who is seen in follow-up.   Diagnosis: #1 schizoaffective disorder; bipolar type versus bipolar disorder, most recently manic with psychotic features, #2 cocaine use disorder versus dependence  Patient is seen in follow-up.  She appears to be improved from admission, but clearly still manic.  I would like to restart either lithium or Depakote on her, but given her compliance issues I worry about doing that.  We will discussed the possibility of restarting something like that while I see her over the weekend.  She is requesting discharge, but I have told her that the team is working on contacting her ACT's team and getting them involved as well as finding about the long-acting Abilify injection.  No change in her current psychiatric medications.  She did have a TSH done in April on her last hospitalization that was within normal limits, but I have written for a metabolic panel. 1.  Continue Risperdal 3 mg p.o. twice daily for psychosis and mood stability. 2.  Continue temazepam 30 mg p.o. nightly for insomnia. 3.  Continue as needed Haldol as well as lorazepam for agitation. 4.  Attempt to get metabolic panel.  #5 disposition planning-in progress.  Sharma Covert, MD 07/16/2019, 11:31 AM

## 2019-07-16 NOTE — Progress Notes (Signed)
Pt woke up requesting fluids which was given to her. Pt remains sedated and unsteady in gait. HFR initiated. Pt requesting to speak to provider this a.m. about discharge.

## 2019-07-16 NOTE — Progress Notes (Signed)
Pt in room sedated at shift change.  Pt wakes up long enough to refuse further medication.  Pt returns to sleep. Medications not given and limited assessment performed. Pt remains in bed sleeping.

## 2019-07-16 NOTE — Progress Notes (Signed)
   07/16/19 0753  COVID-19 Daily Checkoff  Have you had a fever (temp > 37.80C/100F)  in the past 24 hours?  No  If you have had runny nose, nasal congestion, sneezing in the past 24 hours, has it worsened? No  COVID-19 EXPOSURE  Have you traveled outside the state in the past 14 days? No  Have you been in contact with someone with a confirmed diagnosis of COVID-19 or PUI in the past 14 days without wearing appropriate PPE? No  Have you been living in the same home as a person with confirmed diagnosis of COVID-19 or a PUI (household contact)? No  Have you been diagnosed with COVID-19? No

## 2019-07-16 NOTE — Progress Notes (Signed)
D: Patient presents anxious about discharge, hoping to discharge today. Her speech is pressure. Patient denies SI/HI/AVH. She reports sleeping well last night and not requiring medication to help. Her appetite is good, energy high and concentration good. She rates her depression, hopelessness and anxiety 0/10. She denies withdrawal symptoms or physical complaints.  A: Patient checked q15 min, and checks reviewed. Reviewed medication changes with patient and educated on side effects. Educated patient on importance of attending group therapy sessions and educated on several coping skills. Encouarged participation in milieu through recreation therapy and attending meals with peers. Support and encouragement provided. Fluids offered. R: Patient receptive to education on medications, and is medication compliant. Patient contracts for safety on the unit.

## 2019-07-16 NOTE — Progress Notes (Signed)
D: Patient demonstrates escalating behavior. Cursing at staff, arguing, increasingly pressured speech. Her speech is both disorganized and flight of ideas. She cannot complete sentences due to rapidity of thoughts. She describes how she feels that she is mistreated by her ACTT team and "I need to walk to get my paycheck," referring to her disability check.  A: Offered PO medication for agitation. She was given PO Ativan and benadryl.  R: Patient refused PRN haldol, but since she took the risperdal, I did not push this medication.

## 2019-07-16 NOTE — Progress Notes (Signed)
The focus of this group is to help patients establish daily goals to achieve during treatment and discuss how the patient can incorporate goal setting into their daily lives to aide in recovery. 

## 2019-07-16 NOTE — [facility] Group Notes (Signed)
  [facility] LCSW Group Therapy Note  Date/Time:  07/16/2019 11:15AM-12:00PM  Type of Therapy and Topic:  Group Therapy:  Feelings About Hospitalization  Participation Level: Group was deferred for outdoor recreation due to the weather forecast  Description of Group This process group involved patients discussing their feelings related to being hospitalized, as well as the benefits they see to being in the hospital.  These feelings and benefits were itemized.  The group then brainstormed specific ways in which they could seek those same benefits when they discharge and return home.  Therapeutic Goals 1. Patient will identify and describe positive and negative feelings related to hospitalization 2. Patient will verbalize benefits of hospitalization to themselves personally 3. Patients will brainstorm together ways they can obtain similar benefits in the outpatient setting, identify barriers to wellness and possible solutions  Summary of Patient Progress:  N/A  Therapeutic Modalities Cognitive Behavioral Therapy Motivational Interviewing    Abdulmalik Darco Grossman-Orr, LCSW 07/16/2019, 8:52 AM    

## 2019-07-17 LAB — COMPREHENSIVE METABOLIC PANEL
ALT: 13 U/L (ref 0–44)
ALT: 14 U/L (ref 0–44)
AST: 20 U/L (ref 15–41)
AST: 21 U/L (ref 15–41)
Albumin: 3.4 g/dL — ABNORMAL LOW (ref 3.5–5.0)
Albumin: 3.6 g/dL (ref 3.5–5.0)
Alkaline Phosphatase: 65 U/L (ref 38–126)
Alkaline Phosphatase: 65 U/L (ref 38–126)
Anion gap: 11 (ref 5–15)
Anion gap: 9 (ref 5–15)
BUN: 7 mg/dL (ref 6–20)
BUN: 7 mg/dL (ref 6–20)
CO2: 24 mmol/L (ref 22–32)
CO2: 25 mmol/L (ref 22–32)
Calcium: 8.7 mg/dL — ABNORMAL LOW (ref 8.9–10.3)
Calcium: 8.9 mg/dL (ref 8.9–10.3)
Chloride: 106 mmol/L (ref 98–111)
Chloride: 107 mmol/L (ref 98–111)
Creatinine, Ser: 0.66 mg/dL (ref 0.44–1.00)
Creatinine, Ser: 0.73 mg/dL (ref 0.44–1.00)
GFR calc Af Amer: >60 mL/min (ref >60)
GFR calc Af Amer: >60 mL/min (ref >60)
GFR calc non Af Amer: >60 mL/min (ref >60)
GFR calc non Af Amer: >60 mL/min (ref >60)
Glucose, Bld: 80 mg/dL (ref 70–99)
Glucose, Bld: 81 mg/dL (ref 70–99)
Potassium: 3.5 mmol/L (ref 3.5–5.1)
Potassium: 3.6 mmol/L (ref 3.5–5.1)
Sodium: 141 mmol/L (ref 135–145)
Sodium: 141 mmol/L (ref 135–145)
Total Bilirubin: 0.3 mg/dL (ref 0.3–1.2)
Total Bilirubin: 0.7 mg/dL (ref 0.3–1.2)
Total Protein: 6.5 g/dL (ref 6.5–8.1)
Total Protein: 6.8 g/dL (ref 6.5–8.1)

## 2019-07-17 LAB — LIPID PANEL
Cholesterol: 146 mg/dL (ref 0–200)
HDL: 56 mg/dL (ref >40)
LDL Cholesterol: 80 mg/dL (ref 0–99)
Total CHOL/HDL Ratio: 2.6 RATIO
Triglycerides: 51 mg/dL (ref <150)
VLDL: 10 mg/dL (ref 0–40)

## 2019-07-17 LAB — TSH: TSH: 1.919 u[IU]/mL (ref 0.350–4.500)

## 2019-07-17 LAB — HEMOGLOBIN A1C
Hgb A1c MFr Bld: 4.9 % (ref 4.8–5.6)
Mean Plasma Glucose: 93.93 mg/dL

## 2019-07-17 LAB — CBC
HCT: 37.7 % (ref 36.0–46.0)
Hemoglobin: 11.7 g/dL — ABNORMAL LOW (ref 12.0–15.0)
MCH: 30.6 pg (ref 26.0–34.0)
MCHC: 31 g/dL (ref 30.0–36.0)
MCV: 98.7 fL (ref 80.0–100.0)
Platelets: 171 10*3/uL (ref 150–400)
RBC: 3.82 MIL/uL — ABNORMAL LOW (ref 3.87–5.11)
RDW: 16.8 % — ABNORMAL HIGH (ref 11.5–15.5)
WBC: 5.1 10*3/uL (ref 4.0–10.5)
nRBC: 0 % (ref 0.0–0.2)

## 2019-07-17 LAB — ETHANOL: Alcohol, Ethyl (B): <10 mg/dL (ref <10)

## 2019-07-17 MED ORDER — BOOST PLUS PO LIQD
237.0000 mL | Freq: Two times a day (BID) | ORAL | Status: DC
  Administered 2019-07-17 – 2019-07-18 (×2): 237 mL via ORAL
  Filled 2019-07-17 (×4): qty 237

## 2019-07-17 NOTE — [facility] Group Notes (Signed)
[facility] LCSW Group Therapy Note  Date/Time:  07/17/2019  11:15AM-12:00PM  Type of Therapy and Topic:  Group Therapy:  Music and Mood  Participation Level:  Did Not Attend - Group was deferred for outdoor reaction time due to the weather.  Description of Group: In this process group, members listened to a variety of genres of music and identified that different types of music evoke different responses.  Patients were encouraged to identify music that was soothing for them and music that was energizing for them.  Patients discussed how this knowledge can help with wellness and recovery in various ways including managing depression and anxiety as well as encouraging healthy sleep habits.    Therapeutic Goals: 1. Patients will explore the impact of different varieties of music on mood 2. Patients will verbalize the thoughts they have when listening to different types of music 3. Patients will identify music that is soothing to them as well as music that is energizing to them 4. Patients will discuss how to use this knowledge to assist in maintaining wellness and recovery 5. Patients will explore the use of music as a coping skill  Summary of Patient Progress:  N/A  Therapeutic Modalities: Solution Focused Brief Therapy Activity   Shawnette Augello Grossman-Orr, LCSW 

## 2019-07-17 NOTE — Progress Notes (Signed)
   07/17/19 0842  COVID-19 Daily Checkoff  Have you had a fever (temp > 37.80C/100F)  in the past 24 hours?  No  If you have had runny nose, nasal congestion, sneezing in the past 24 hours, has it worsened? No  COVID-19 EXPOSURE  Have you traveled outside the state in the past 14 days? No  Have you been in contact with someone with a confirmed diagnosis of COVID-19 or PUI in the past 14 days without wearing appropriate PPE? No  Have you been living in the same home as a person with confirmed diagnosis of COVID-19 or a PUI (household contact)? No  Have you been diagnosed with COVID-19? No

## 2019-07-17 NOTE — Progress Notes (Signed)
Patient ID: Melinda Velez, female   DOB: 09/18/1986, 33 y.o.   MRN: [id number]   [place] NOVEL CORONAVIRUS (COVID-19) DAILY CHECK-OFF SYMPTOMS - answer yes or no to each - every day NO YES  Have you had a fever in the past 24 hours?  . Fever (Temp > 37.80C / 100F) X   Have you had any of these symptoms in the past 24 hours? . New Cough .  Sore Throat  .  Shortness of Breath .  Difficulty Breathing .  Unexplained Body Aches   X   Have you had any one of these symptoms in the past 24 hours not related to allergies?   . Runny Nose .  Nasal Congestion .  Sneezing   X   If you have had runny nose, nasal congestion, sneezing in the past 24 hours, has it worsened?  X   EXPOSURES - check yes or no X   Have you traveled outside the state in the past 14 days?  X   Have you been in contact with someone with a confirmed diagnosis of COVID-19 or PUI in the past 14 days without wearing appropriate PPE?  X   Have you been living in the same home as a person with confirmed diagnosis of COVID-19 or a PUI (household contact)?    X   Have you been diagnosed with COVID-19?    X              What to do next: Answered NO to all: Answered YES to anything:   Proceed with unit schedule Follow the [facility] Inpatient Flowsheet.

## 2019-07-17 NOTE — Progress Notes (Signed)
Adult Psychoeducational Group Note  Date:  07/17/2019 Time:  11:54 PM  Group Topic/Focus:  Wrap-Up Group:   The focus of this group is to help patients review their daily goal of treatment and discuss progress on daily workbooks.  Participation Level:  Minimal  Participation Quality:  Appropriate  Affect:  Appropriate  Cognitive:  Appropriate  Insight: Limited  Engagement in Group:  Limited  Modes of Intervention:  Discussion  Additional Comments: Pt stated her goal for today was talk with her doctor about her discharged plan. Pt stated she was able to talk with her doctor today but they did not discuss her discharge plan today. Pt rated his day a 3 out of 10. Pt stated she was starting to feel better today and she notice her appetite is improving. Pt stated she talk to her nurse about her medication and the plan is for her to get a good nights rest.   Candy Sledge 07/17/2019, 11:54 PM

## 2019-07-17 NOTE — Progress Notes (Signed)
[facility] MD Progress Note  07/17/2019 9:28 AM Melinda Velez  MRN:  [id number] Subjective:  Patient is a 33 year old female with a past psychiatric history significant for schizoaffective disorder versus bipolar disorder and cocaine use disorder who was admitted on 07/14/2019 after being involuntarily committed by her ACT team.  Objective: Patient is seen and examined.  Patient is a 33 year old female with the above-stated past psychiatric history who is seen in follow-up.  Because of her continued manic symptoms she was started on Depakote yesterday in addition to her other medication.  She had previously been treated with lithium as well as Depakote in the past.  She had denied any previous side effects of this.  Her vital signs are stable, she is afebrile.  She slept approximately 5 hours last night.  Review of her laboratories revealed normal CMP, a mild anemia, normal TSH.  She apparently refused her medications at bedtime last night.  It sounds like she had refused the Haldol.  Her speech rate is decreased today, and she was sleeping this morning in her room and was easily arousable.  She denied any auditory or visual hallucinations.  She denied any suicidal or homicidal ideation.  Principal Problem: <principal problem not specified> Diagnosis: Active Problems:   Schizophrenia ([place])  Total Time spent with patient: 15 minutes  Past Psychiatric History: See admission H&P  Past Medical History: History reviewed. No pertinent past medical history. History reviewed. No pertinent surgical history. Family History: History reviewed. No pertinent family history. Family Psychiatric  History: See admission H&P Social History:  Social History   Substance and Sexual Activity  Alcohol Use No     Social History   Substance and Sexual Activity  Drug Use Not on file   Comment: currently incarcerated    Social History   Socioeconomic History  . Marital status: Single    Spouse name: Not on file  .  Number of children: Not on file  . Years of education: Not on file  . Highest education level: Not on file  Occupational History  . Not on file  Social Needs  . Financial resource strain: Not on file  . Food insecurity    Worry: Not on file    Inability: Not on file  . Transportation needs    Medical: Patient refused    Non-medical: Patient refused  Tobacco Use  . Smoking status: Current Every Day Smoker  . Smokeless tobacco: Never Used  Substance and Sexual Activity  . Alcohol use: No  . Drug use: Not on file    Comment: currently incarcerated  . Sexual activity: Not on file  Lifestyle  . Physical activity    Days per week: Patient refused    Minutes per session: Patient refused  . Stress: Rather much  Relationships  . Social Herbalist on phone: Patient refused    Gets together: Patient refused    Attends religious service: Patient refused    Active member of club or organization: Patient refused    Attends meetings of clubs or organizations: Patient refused    Relationship status: Patient refused  Other Topics Concern  . Not on file  Social History Narrative  . Not on file   Additional Social History:    Pain Medications: please see mar Prescriptions: please see mar Over the Counter: please see mar History of alcohol / drug use?: No history of alcohol / drug abuse Longest period of sobriety (when/how long): NA  Sleep: Fair  Appetite:  Fair  Current Medications: Current Facility-Administered Medications  Medication Dose Route Frequency Provider Last Rate Last Dose  . acetaminophen (TYLENOL) tablet 650 mg  650 mg Oral Q6H PRN Denzil Magnuson, NP      . alum & mag hydroxide-simeth (MAALOX/MYLANTA) 200-200-20 MG/5ML suspension 30 mL  30 mL Oral Q4H PRN Denzil Magnuson, NP      . benztropine (COGENTIN) tablet 1 mg  1 mg Oral BID Malvin Johns, MD   1 mg at 07/17/19 0842  . diphenhydrAMINE (BENADRYL) capsule 25 mg  25 mg Oral  Q6H PRN Malvin Johns, MD   25 mg at 07/16/19 1653   Or  . diphenhydrAMINE (BENADRYL) injection 25 mg  25 mg Intramuscular Q6H PRN Malvin Johns, MD   25 mg at 07/13/19 1630  . divalproex (DEPAKOTE) DR tablet 500 mg  500 mg Oral Q12H Antonieta Pert, MD   500 mg at 07/17/19 0842  . haloperidol (HALDOL) tablet 10 mg  10 mg Oral Q6H PRN Malvin Johns, MD   10 mg at 07/14/19 1423   Or  . haloperidol lactate (HALDOL) injection 10 mg  10 mg Intramuscular Q6H PRN Malvin Johns, MD   10 mg at 07/13/19 1630  . LORazepam (ATIVAN) tablet 2 mg  2 mg Oral Q4H PRN Malvin Johns, MD   2 mg at 07/14/19 1422   Or  . LORazepam (ATIVAN) injection 2 mg  2 mg Intramuscular Q4H PRN Malvin Johns, MD   2 mg at 07/13/19 1630  . LORazepam (ATIVAN) tablet 4 mg  4 mg Oral Q6H PRN Malvin Johns, MD   4 mg at 07/16/19 1652   Or  . LORazepam (ATIVAN) injection 4 mg  4 mg Intramuscular Q6H PRN Malvin Johns, MD      . risperiDONE (RISPERDAL) tablet 3 mg  3 mg Oral BID Malvin Johns, MD   3 mg at 07/17/19 0842  . temazepam (RESTORIL) capsule 30 mg  30 mg Oral QHS Malvin Johns, MD   30 mg at 07/14/19 2204  . ziprasidone (GEODON) injection 20 mg  20 mg Intramuscular Q12H PRN Malvin Johns, MD        Lab Results:  Results for orders placed or performed during the hospital encounter of 07/13/19 (from the past 48 hour(s))  CBC     Status: Abnormal   Collection Time: 07/17/19  6:47 AM  Result Value Ref Range   WBC 5.1 4.0 - 10.5 K/uL   RBC 3.82 (L) 3.87 - 5.11 MIL/uL   Hemoglobin 11.7 (L) 12.0 - 15.0 g/dL   HCT 16.1 09.6 - 04.5 %   MCV 98.7 80.0 - 100.0 fL   MCH 30.6 26.0 - 34.0 pg   MCHC 31.0 30.0 - 36.0 g/dL   RDW 40.9 (H) 81.1 - 91.4 %   Platelets 171 150 - 400 K/uL   nRBC 0.0 0.0 - 0.2 %    Comment: Performed at [facility], [address] [place], [place] [address]  Comprehensive metabolic panel     Status: Abnormal   Collection Time: 07/17/19  6:47 AM  Result Value Ref Range   Sodium 141 135 -  145 mmol/L   Potassium 3.6 3.5 - 5.1 mmol/L   Chloride 106 98 - 111 mmol/L   CO2 24 22 - 32 mmol/L   Glucose, Bld 81 70 - 99 mg/dL   BUN 7 6 - 20 mg/dL   Creatinine, Ser 6.21 0.44 - 1.00 mg/dL  Calcium 8.7 (L) 8.9 - 10.3 mg/dL   Total Protein 6.5 6.5 - 8.1 g/dL   Albumin 3.4 (L) 3.5 - 5.0 g/dL   AST 21 15 - 41 U/L   ALT 14 0 - 44 U/L   Alkaline Phosphatase 65 38 - 126 U/L   Total Bilirubin 0.3 0.3 - 1.2 mg/dL   GFR calc non Af Amer >60 >60 mL/min   GFR calc Af Amer >60 >60 mL/min   Anion gap 11 5 - 15    Comment: Performed at [facility], [address] [place], [place] [address]  Ethanol     Status: None   Collection Time: 07/17/19  6:47 AM  Result Value Ref Range   Alcohol, Ethyl (B) <10 <10 mg/dL    Comment: (NOTE) Lowest detectable limit for serum alcohol is 10 mg/dL. For medical purposes only. Performed at [facility], [address] [place], [place] [address]   Lipid panel     Status: None   Collection Time: 07/17/19  6:47 AM  Result Value Ref Range   Cholesterol 146 0 - 200 mg/dL   Triglycerides 51 <657<150 mg/dL   HDL 56 >84>40 mg/dL   Total CHOL/HDL Ratio 2.6 RATIO   VLDL 10 0 - 40 mg/dL   LDL Cholesterol 80 0 - 99 mg/dL    Comment:        Total Cholesterol/HDL:CHD Risk Coronary Heart Disease Risk Table                     Men   Women  1/2 Average Risk   3.4   3.3  Average Risk       5.0   4.4  2 X Average Risk   9.6   7.1  3 X Average Risk  23.4   11.0        Use the calculated Patient Ratio above and the CHD Risk Table to determine the patient's CHD Risk.        ATP III CLASSIFICATION (LDL):  <100     mg/dL   Optimal  [id number]  mg/dL   Near or Above                    Optimal  130-159  mg/dL   Borderline  [id number]  mg/dL   High  >440>190     mg/dL   Very High Performed at [facility], [address] [place], [place] [address]   TSH     Status: None   Collection Time: 07/17/19  6:47 AM  Result  Value Ref Range   TSH 1.919 0.350 - 4.500 uIU/mL    Comment: Performed by a 3rd Generation assay with a functional sensitivity of <=0.01 uIU/mL. Performed at [facility], [address] [place], [place] [address]   Comprehensive metabolic panel     Status: None   Collection Time: 07/17/19  6:47 AM  Result Value Ref Range   Sodium 141 135 - 145 mmol/L   Potassium 3.5 3.5 - 5.1 mmol/L   Chloride 107 98 - 111 mmol/L   CO2 25 22 - 32 mmol/L   Glucose, Bld 80 70 - 99 mg/dL   BUN 7 6 - 20 mg/dL   Creatinine, Ser 4.030.73 0.44 - 1.00 mg/dL   Calcium 8.9 8.9 - 47.410.3 mg/dL   Total Protein 6.8 6.5 - 8.1 g/dL   Albumin 3.6 3.5 - 5.0 g/dL   AST  20 15 - 41 U/L   ALT 13 0 - 44 U/L   Alkaline Phosphatase 65 38 - 126 U/L   Total Bilirubin 0.7 0.3 - 1.2 mg/dL   GFR calc non Af Amer >60 >60 mL/min   GFR calc Af Amer >60 >60 mL/min   Anion gap 9 5 - 15    Comment: Performed at [facility], [address] [place], [place] [address]    Blood Alcohol level:  Lab Results  Component Value Date   ETH <10 07/17/2019   ETH <10 04/27/2019    Metabolic Disorder Labs: No results found for: HGBA1C, MPG No results found for: PROLACTIN Lab Results  Component Value Date   CHOL 146 07/17/2019   TRIG 51 07/17/2019   HDL 56 07/17/2019   CHOLHDL 2.6 07/17/2019   VLDL 10 07/17/2019   LDLCALC 80 07/17/2019    Physical Findings: AIMS: Facial and Oral Movements Muscles of Facial Expression: None, normal Lips and Perioral Area: None, normal Jaw: None, normal Tongue: None, normal,Extremity Movements Upper (arms, wrists, hands, fingers): None, normal Lower (legs, knees, ankles, toes): None, normal, Trunk Movements Neck, shoulders, hips: None, normal, Overall Severity Severity of abnormal movements (highest score from questions above): None, normal Incapacitation due to abnormal movements: None, normal Patient's awareness of abnormal movements (rate only patient's  report): No Awareness, Dental Status Current problems with teeth and/or dentures?: No Does patient usually wear dentures?: No  CIWA:    COWS:     Musculoskeletal: Strength & Muscle Tone: within normal limits Gait & Station: normal Patient leans: N/A  Psychiatric Specialty Exam: Physical Exam  Nursing note and vitals reviewed. Constitutional: She is oriented to person, place, and time. She appears well-developed and well-nourished.  HENT:  Head: Normocephalic and atraumatic.  Respiratory: Effort normal.  Neurological: She is alert and oriented to person, place, and time.    ROS  Blood pressure 117/73, pulse 98, temperature (!) 97.5 F (36.4 C), resp. rate 18, height 5\' 5"  (1.651 m), weight 54.4 kg, SpO2 100 %.Body mass index is 19.97 kg/m.  General Appearance: Casual  Eye Contact:  Fair  Speech:  Normal Rate  Volume:  Decreased  Mood:  Anxious  Affect:  Congruent  Thought Process:  Coherent and Descriptions of Associations: Intact  Orientation:  Full (Time, Place, and Person)  Thought Content:  Logical  Suicidal Thoughts:  No  Homicidal Thoughts:  No  Memory:  Immediate;   Fair Recent;   Fair Remote;   Fair  Judgement:  Intact  Insight:  Fair  Psychomotor Activity:  Increased  Concentration:  Concentration: Fair and Attention Span: Fair  Recall:  F[employer]Fair  Fund of Knowledge:  Fair  Language:  Fair  Akathisia:  Negative  Handed:  Right  AIMS (if indicated):     Assets:  Desire for Improvement Resilience  ADL's:  Intact  Cognition:  WNL  Sleep:  Number of Hours: 4.75     Treatment Plan Summary: Daily contact with patient to assess and evaluate symptoms and progress in treatment, Medication management and Plan : Patient is seen and examined.  Patient is a 33 year old female with the above-stated past psychiatric history who is seen in follow-up.  Diagnosis: #1 schizoaffective disorder; bipolar type versus bipolar disorder, most recently manic with psychotic  features, #2 cocaine use disorder versus dependence  She seems less manic today.  She still mildly tangential and mildly pressured.  We will continue the Depakote as previously written.  She probably  refused the Risperdal or Haldol last night, but I am not going to change any of her medications at this point.  We will get a Depakote level prior to discharge.  Her metabolic panel appears to be within normal limits. 1.  Continue Risperdal 3 mg p.o. twice daily for psychosis and mood stability. 2.  Continue temazepam 30 mg p.o. nightly for insomnia. 3.  Continue as needed Haldol as well as lorazepam for agitation. 4.  Continue Depakote DR 500 mg p.o. every afternoon for mood stability. 5.  disposition planning-in progress. Antonieta PertGreg Lawson Daeshawn Redmann, MD 07/17/2019, 9:28 AM

## 2019-07-17 NOTE — Progress Notes (Signed)
D: Patient presents manic, irritable, labile. Patient denies SI/HI/AVH. She is frequently up at the nurse's station, requesting discharge and asking when her discharge will take place. She slept fair last night. A: Patient checked q15 min, and checks reviewed. Reviewed medication changes with patient and educated on side effects. Educated patient on importance of attending group therapy sessions and educated on several coping skills. Encouarged participation in milieu through recreation therapy and attending meals with peers. Support and encouragement provided. Fluids offered. R: Patient receptive to education on medications, and is medication compliant. Patient contracts for safety on the unit.

## 2019-07-18 MED ORDER — RISPERIDONE 3 MG PO TABS: 6.0000 mg | ORAL_TABLET | Freq: Every day | ORAL | 2 refills | Status: AC

## 2019-07-18 MED ORDER — BENZTROPINE MESYLATE 1 MG PO TABS: 1.0000 mg | ORAL_TABLET | Freq: Two times a day (BID) | ORAL | 3 refills | Status: AC

## 2019-07-18 MED ORDER — DIVALPROEX SODIUM 500 MG PO DR TAB: 500.0000 mg | DELAYED_RELEASE_TABLET | Freq: Two times a day (BID) | ORAL | 2 refills | Status: AC

## 2019-07-18 NOTE — Progress Notes (Signed)
Recreation Therapy Notes  INPATIENT RECREATION TR PLAN  Patient Details Name: Melinda Velez MRN: [id number] DOB: Feb 01, 1986 Today's Date: 07/18/2019  Rec Therapy Plan Is patient appropriate for Therapeutic Recreation?: Yes Treatment times per week: about 3 days Estimated Length of Stay: 5-7 days TR Treatment/Interventions: Group participation (Comment)  Discharge Criteria Treatment plan/goals/alternatives discussed and agreed upon by:: Patient/family  Discharge Summary Short term goals set: see patient care plan Short term goals met: Not met Reason goals not met: PAtient did not attend any offered groups. Therapeutic equipment acquired: n/a Reason patient discharged from therapy: Discharge from hospital Pt/family agrees with progress & goals achieved: Yes Date patient discharged from therapy: 07/18/19  Tomi Likens, LRT/CTRS  [place] 07/18/2019, 4:27 PM

## 2019-07-18 NOTE — Tx Team (Signed)
Interdisciplinary Treatment and Diagnostic Plan Update  07/18/2019 Time of Session: 09:02am Melinda Velez MRN: [id number]  Principal Diagnosis: <principal problem not specified>  Secondary Diagnoses: Active Problems:   Schizophrenia ([facility])   Current Medications:  Current Facility-Administered Medications  Medication Dose Route Frequency Provider Last Rate Last Dose  . acetaminophen (TYLENOL) tablet 650 mg  650 mg Oral Q6H PRN Denzil Magnusonhomas, Lashunda, NP   650 mg at 07/17/19 1852  . alum & mag hydroxide-simeth (MAALOX/MYLANTA) 200-200-20 MG/5ML suspension 30 mL  30 mL Oral Q4H PRN Denzil Magnusonhomas, Lashunda, NP      . benztropine (COGENTIN) tablet 1 mg  1 mg Oral BID Malvin JohnsFarah, Brian, MD   1 mg at 07/18/19 0747  . diphenhydrAMINE (BENADRYL) capsule 25 mg  25 mg Oral Q6H PRN Malvin JohnsFarah, Brian, MD   25 mg at 07/16/19 1653   Or  . diphenhydrAMINE (BENADRYL) injection 25 mg  25 mg Intramuscular Q6H PRN Malvin JohnsFarah, Brian, MD   25 mg at 07/13/19 1630  . divalproex (DEPAKOTE) DR tablet 500 mg  500 mg Oral Q12H Antonieta Pertlary, Greg Lawson, MD   500 mg at 07/18/19 0747  . haloperidol (HALDOL) tablet 10 mg  10 mg Oral Q6H PRN Malvin JohnsFarah, Brian, MD   10 mg at 07/14/19 1423   Or  . haloperidol lactate (HALDOL) injection 10 mg  10 mg Intramuscular Q6H PRN Malvin JohnsFarah, Brian, MD   10 mg at 07/13/19 1630  . lactose free nutrition (BOOST PLUS) liquid 237 mL  237 mL Oral BID BM Antonieta Pertlary, Greg Lawson, MD   237 mL at 07/17/19 1659  . LORazepam (ATIVAN) tablet 2 mg  2 mg Oral Q4H PRN Malvin JohnsFarah, Brian, MD   2 mg at 07/17/19 1252   Or  . LORazepam (ATIVAN) injection 2 mg  2 mg Intramuscular Q4H PRN Malvin JohnsFarah, Brian, MD   2 mg at 07/13/19 1630  . LORazepam (ATIVAN) tablet 4 mg  4 mg Oral Q6H PRN Malvin JohnsFarah, Brian, MD   4 mg at 07/16/19 1652   Or  . LORazepam (ATIVAN) injection 4 mg  4 mg Intramuscular Q6H PRN Malvin JohnsFarah, Brian, MD      . risperiDONE (RISPERDAL) tablet 3 mg  3 mg Oral BID Malvin JohnsFarah, Brian, MD   3 mg at 07/18/19 0747  . temazepam (RESTORIL) capsule 30 mg  30 mg Oral  QHS Malvin JohnsFarah, Brian, MD   30 mg at 07/17/19 2132  . ziprasidone (GEODON) injection 20 mg  20 mg Intramuscular Q12H PRN Malvin JohnsFarah, Brian, MD       PTA Medications: Medications Prior to Admission  Medication Sig Dispense Refill Last Dose  . ABILIFY MAINTENA 400 MG PRSY prefilled syringe Inject 400 mg as directed every 30 (thirty) days.     . ARIPiprazole (ABILIFY) 10 MG tablet Take 10 mg by mouth daily.     . divalproex (DEPAKOTE ER) 500 MG 24 hr tablet Take 500 mg by mouth 2 (two) times a day.     . lithium carbonate (ESKALITH) 450 MG CR tablet Take 450 mg by mouth 2 (two) times a day.       Patient Stressors:    Patient Strengths:    Treatment Modalities: Medication Management, Group therapy, Case management,  1 to 1 session with clinician, Psychoeducation, Recreational therapy.   Physician Treatment Plan for Primary Diagnosis: <principal problem not specified> Long Term Goal(s): Improvement in symptoms so as ready for discharge Improvement in symptoms so as ready for discharge   Short Term Goals: Ability to disclose and discuss  suicidal ideas Ability to demonstrate self-control will improve Ability to identify and develop effective coping behaviors will improve Ability to identify and develop effective coping behaviors will improve Ability to maintain clinical measurements within normal limits will improve Compliance with prescribed medications will improve  Medication Management: Evaluate patient's response, side effects, and tolerance of medication regimen.  Therapeutic Interventions: 1 to 1 sessions, Unit Group sessions and Medication administration.  Evaluation of Outcomes: Adequate for Discharge  Physician Treatment Plan for Secondary Diagnosis: Active Problems:   Schizophrenia ([place])  Long Term Goal(s): Improvement in symptoms so as ready for discharge Improvement in symptoms so as ready for discharge   Short Term Goals: Ability to disclose and discuss suicidal  ideas Ability to demonstrate self-control will improve Ability to identify and develop effective coping behaviors will improve Ability to identify and develop effective coping behaviors will improve Ability to maintain clinical measurements within normal limits will improve Compliance with prescribed medications will improve     Medication Management: Evaluate patient's response, side effects, and tolerance of medication regimen.  Therapeutic Interventions: 1 to 1 sessions, Unit Group sessions and Medication administration.  Evaluation of Outcomes: Adequate for Discharge   RN Treatment Plan for Primary Diagnosis: <principal problem not specified> Long Term Goal(s): Knowledge of disease and therapeutic regimen to maintain health will improve  Short Term Goals: Ability to participate in decision making will improve, Ability to verbalize feelings will improve, Ability to disclose and discuss suicidal ideas, Ability to identify and develop effective coping behaviors will improve and Compliance with prescribed medications will improve  Medication Management: RN will administer medications as ordered by provider, will assess and evaluate patient's response and provide education to patient for prescribed medication. RN will report any adverse and/or side effects to prescribing provider.  Therapeutic Interventions: 1 on 1 counseling sessions, Psychoeducation, Medication administration, Evaluate responses to treatment, Monitor vital signs and CBGs as ordered, Perform/monitor CIWA, COWS, AIMS and Fall Risk screenings as ordered, Perform wound care treatments as ordered.  Evaluation of Outcomes: Adequate for Discharge   LCSW Treatment Plan for Primary Diagnosis: <principal problem not specified> Long Term Goal(s): Safe transition to appropriate next level of care at discharge, Engage patient in therapeutic group addressing interpersonal concerns.  Short Term Goals: Engage patient in aftercare  planning with referrals and resources and Increase skills for wellness and recovery  Therapeutic Interventions: Assess for all discharge needs, 1 to 1 time with Social worker, Explore available resources and support systems, Assess for adequacy in community support network, Educate family and significant other(s) on suicide prevention, Complete Psychosocial Assessment, Interpersonal group therapy.  Evaluation of Outcomes: Adequate for Discharge   Progress in Treatment: Attending groups: No. Participating in groups: No. Taking medication as prescribed: Yes. Toleration medication: Yes. Family/Significant other contact made: No, will contact:  pt's ACTT counselor Lanny Hurst) Patient understands diagnosis: No. Discussing patient identified problems/goals with staff: Yes. Medical problems stabilized or resolved: Yes. Denies suicidal/homicidal ideation: Yes. Issues/concerns per patient self-inventory: No. Other:   New problem(s) identified: No, Describe:  None  New Short Term/Long Term Goal(s): Medication stabilization, elimination of SI thoughts, and development of a comprehensive mental wellness plan.   Patient Goals:  "To get out"  Discharge Plan or Barriers: Pt is discharging today. Pt will be following up with her ACTT team.  Reason for Continuation of Hospitalization: Patient is discharging today.   Estimated Length of Stay: Patient is discharging today.   Attendees: Patient: 07/18/2019  Physician: Dr. Johnn Hai, MD 07/18/2019  Nursing: Luanne BrasRoni, RN 07/18/2019  RN Care Manager: 07/18/2019  Social Worker: Stephannie PetersJasmine Krysia Z[place]rad[place], [place] 07/18/2019   Recreational Therapist:  07/18/2019   Other:  07/18/2019  Other:  07/18/2019   Other: 07/18/2019      Scribe for Treatment Team: Delphia GratesJasmine M Tanysha Quant, LCSW 07/18/2019 9:57 AM

## 2019-07-18 NOTE — Plan of Care (Signed)
Patient did not meet goal due to lack of attendance in groups.

## 2019-07-18 NOTE — Progress Notes (Signed)
Recreation Therapy Notes  Date: 07/18/2019 Time: 10:00 am Location: [address]   Group Topic: Anger Thermometer  Goal Area(s) Addresses:  Patient will work on worksheet on Anger Thermometer. Patient will follow directions on first prompt.  Behavioral Response: Appropriate  Intervention: Worksheet  Activity:  Staff on [address] were provided with a worksheet on Anger Thermometer. Staff was instructed to give it to the patients and have them work on it in place of Recreation Therapy Group. Staff was also given 2 coloring sheets and 2 word searches and were given the option to give them out.  Education:  Ability to follow Directions, Change of thought processes Discharge Planning, Goal Planning.   Education Outcome: Acknowledges education/In group clarification offered  Clinical Observations/Feedback: . Due to COVID-19, guidelines group was not held. Group members were provided a learning activity worksheet to work on the topic and above-stated goals. LRT is available to answer any questions patient may have regarding the worksheet.  Zylee Marchiano L Mills Mitton, LRT/CTRS         Kacey Dysert L Lenvil Swaim 07/18/2019 10:11 AM 

## 2019-07-18 NOTE — Progress Notes (Signed)
Patient ID: Melinda Velez, female   DOB: 1986/03/25, 33 y.o.   MRN: [id number]   Patient discharged to home self care in the presence of her act team.  Patient denies SI, HI and AVH at time of discharge. Patient acknowledged understanding of all discharge instructions and receipt of personal belongings.

## 2019-07-18 NOTE — [facility]HH Suicide Risk Assessment (Signed)
[facility] INPATIENT:  Family/Significant Other Suicide Prevention Education  Suicide Prevention Education:  Education Completed; Pt's ACTT team Lonn Georgia), has been identified by the patient as the family member/significant other with whom the patient will be residing, and identified as the person(s) who will aid the patient in the event of a mental health crisis (suicidal ideations/suicide attempt).  With written consent from the patient, the family member/significant other has been provided the following suicide prevention education, prior to the and/or following the discharge of the patient.  The suicide prevention education provided includes the following:  Suicide risk factors  Suicide prevention and interventions  National Suicide Hotline telephone number  [facility] assessment telephone number  [facility] Emergency Assistance [place] and/or Residential Mobile Crisis Unit telephone number  Request made of family/significant other to:  Remove weapons (e.g., guns, rifles, knives), all items previously/currently identified as safety concern.    Remove drugs/medications (over-the-counter, prescriptions, illicit drugs), all items previously/currently identified as a safety concern.  The family member/significant other verbalizes understanding of the suicide prevention education information provided.  The family member/significant other agrees to remove the items of safety concern listed above.  CSW contacted pt's ACTT team and talked with the team lead, Kayla. Pt's team lead stated that the patient just got out of jail before going to the hospital. Pt does have an apartment but has been staying in a hotel. Pt will be going to another hotel and one of the ACTT members will be meeting her there at 11:30am for their routine check in and session.  Trecia Rogers 07/18/2019, 10:40 AM

## 2019-07-18 NOTE — Discharge Summary (Signed)
Physician Discharge Summary Note  Patient:  Melinda Velez is an 33 y.o., female MRN:  [id number] DOB:  September 29, 1986 Patient phone:  [phone] (home)  Patient address:   [address] [place] [id number],  Total Time spent with patient: 45 minutes  Date of Admission:  07/13/2019 Date of Discharge: 07/18/2019  Reason for Admission:   This is the latest of multiple admissions and psychiatric/behavioral health encounters for this 33 year old single patient who is [facility], reports that she is compliant with her long-acting injectable aripiprazole, but who presented on 7/15 in a disorganized/psychotic state of mind, rambling various delusional statements, poor compliance and agitation, requiring IM medications.  She had a drug screen positive for cocaine but will not elaborate on her usage.  As discussed in the notes of 7/15 she displayed thought blocking, disorganized thought and behavior, and when she did ramble it was about delusional beliefs.  At this point she denies auditory or visual hallucinations, at times irritable stating "people are judging her" and that she "is not a bad person" uses some profanity, rambles is sluggish makes no eye contact. She does however deny current hallucinations and denies thoughts of harming self or others is oriented to person place general situation not day date or time. States she is now living in hotels  Has received IM Benadryl Ativan and Haldol. Previously expressed delusional material including believing her mother was chopped up in her freezer at the hospital and that people are trying to rape her and she was actively hallucinating as recently as 930 this morning.  Last admission and observation was in March of this year, medications were resumed, she did stabilize once again then she was bizarre disorganized but also abusing cocaine.  Principal Problem: psychosis/cocaine abuse Discharge Diagnoses: Active Problems:    Schizophrenia [facility](HCC)   Past Psychiatric History: As discussed this was the latest of numerous admissions  Past Medical History: History reviewed. No pertinent past medical history. History reviewed. No pertinent surgical history. Family History: History reviewed. No pertinent family history. Family Psychiatric  History: No new data Social History:  Social History   Substance and Sexual Activity  Alcohol Use No     Social History   Substance and Sexual Activity  Drug Use Not on file   Comment: currently incarcerated    Social History   Socioeconomic History  . Marital status: Single    Spouse name: Not on file  . Number of children: Not on file  . Years of education: Not on file  . Highest education level: Not on file  Occupational History  . Not on file  Social Needs  . Financial resource strain: Not on file  . Food insecurity    Worry: Not on file    Inability: Not on file  . Transportation needs    Medical: Patient refused    Non-medical: Patient refused  Tobacco Use  . Smoking status: Current Every Day Smoker  . Smokeless tobacco: Never Used  Substance and Sexual Activity  . Alcohol use: No  . Drug use: Not on file    Comment: currently incarcerated  . Sexual activity: Not on file  Lifestyle  . Physical activity    Days per week: Patient refused    Minutes per session: Patient refused  . Stress: Rather much  Relationships  . Social connections    Talks on phone: Patient refused    Gets together: Patient refused    Attends religious service: Patient refused  Active member of club or organization: Patient refused    Attends meetings of clubs or organizations: Patient refused    Relationship status: Patient refused  Other Topics Concern  . Not on file  Social History Narrative  . Not on file    Hospital Course:   This was a fairly typical admission for Tresha once her acute psychosis/disorganization of thought and behavior improved, and she was  suffering no auditory or visual hallucinations, she recalibrated to her hypomanic intrusive state.  She was so intrusive, knocking on my door nearly continually requesting discharge that I had to move off the ward to do rounds.  She never reached the threshold of forced medications during this time but again was so intrusive and problematic in this regard she simply would not take no for an answer.  At any rate she was finally thought to be baseline by the 20th she was alert and oriented she has a bit of a hypomanic intrusive baseline as mentioned but she had no acute auditory or visual hallucinations and no thoughts of harming self or others and could contract fully.  No EPS or TD, no acute cravings tremors or withdrawals from cocaine use Musculoskeletal: Strength & Muscle Tone: within normal limits Gait & Station: normal Patient leans: N/A  Psychiatric Specialty Exam: ROS  Blood pressure 113/81, pulse (!) 113, temperature (!) 97.5 F (36.4 C), resp. rate 18, height 5\' 5"  (1.651 m), weight 54.4 kg, SpO2 100 %.Body mass index is 19.97 kg/m.  General Appearance: Casual  Eye Contact::  Good  Speech:  Clear and Coherent409  Volume:  Normal  Mood:  intrusive  Affect:  Constricted  Thought Process:  Coherent, Linear and Descriptions of Associations: Tangential  Orientation:  Full (Time, Place, and Person)  Thought Content:  Logical and Tangential  Suicidal Thoughts:  No  Homicidal Thoughts:  No  Memory:  Recent;   Fair  Judgement:  Fair  Insight:  Fair  Psychomotor Activity:  NA and Normal  Concentration:  Good  Recall:  Good  Fund of Knowledge:Good  Language: Good  Akathisia:  Negative  Handed:  Right  AIMS (if indicated):     Assets:  Communication Skills Desire for Improvement Leisure Time Physical Health  Sleep:  Number of Hours: 6.75  Cognition: WNL  ADL's:  Intact    Physical Findings: AIMS: Facial and Oral Movements Muscles of Facial Expression: None, normal Lips and  Perioral Area: None, normal Jaw: None, normal Tongue: None, normal,Extremity Movements Upper (arms, wrists, hands, fingers): None, normal Lower (legs, knees, ankles, toes): None, normal, Trunk Movements Neck, shoulders, hips: None, normal, Overall Severity Severity of abnormal movements (highest score from questions above): None, normal Incapacitation due to abnormal movements: None, normal Patient's awareness of abnormal movements (rate only patient's report): No Awareness, Dental Status Current problems with teeth and/or dentures?: No Does patient usually wear dentures?: No  CIWA:    COWS:       Has this patient used any form of tobacco in the last 30 days? (Cigarettes, Smokeless Tobacco, Cigars, and/or Pipes) Yes, No  Blood Alcohol level:  Lab Results  Component Value Date   ETH <10 07/17/2019   ETH <10 04/27/2019    Metabolic Disorder Labs:  Lab Results  Component Value Date   HGBA1C 4.9 07/17/2019   MPG 93.93 07/17/2019   No results found for: PROLACTIN Lab Results  Component Value Date   CHOL 146 07/17/2019   TRIG 51 07/17/2019   HDL 56 07/17/2019  CHOLHDL 2.6 07/17/2019   VLDL 10 07/17/2019   [place] 80 07/17/2019    See Psychiatric Specialty Exam and Suicide Risk Assessment completed by Attending Physician prior to discharge.  Discharge destination:  Home  Is patient on multiple antipsychotic therapies at discharge:  No   Has Patient had three or more failed trials of antipsychotic monotherapy by history:  No  Recommended Plan for Multiple Antipsychotic Therapies: NA   Allergies as of 07/18/2019   No Known Allergies     Medication List    STOP taking these medications   divalproex 500 MG 24 hr tablet Commonly known as: DEPAKOTE ER Replaced by: divalproex 500 MG DR tablet     TAKE these medications     Indication  Abilify Maintena 400 MG Prsy prefilled syringe Generic drug: ARIPiprazole ER Inject 400 mg as directed every 30 (thirty)  days. What changed: Another medication with the same name was removed. Continue taking this medication, and follow the directions you see here.  Indication: Schizophrenia   benztropine 1 MG tablet Commonly known as: COGENTIN Take 1 tablet (1 mg total) by mouth 2 (two) times daily.  Indication: Extrapyramidal Reaction caused by Medications   divalproex 500 MG DR tablet Commonly known as: DEPAKOTE Take 1 tablet (500 mg total) by mouth every 12 (twelve) hours. Replaces: divalproex 500 MG 24 hr tablet  Indication: Manic Phase of Manic-Depression   lithium carbonate 450 MG CR tablet Commonly known as: ESKALITH Take 450 mg by mouth 2 (two) times a day.  Indication: Schizoaffective Disorder   risperiDONE 3 MG tablet Commonly known as: RISPERDAL Take 2 tablets (6 mg total) by mouth at bedtime.  Indication: Hypomanic Episode of Bipolar Disorder      Follow-up [place] Follow up.   Contact information: [place] [address] [phone]          Signed: Johnn Hai, MD 07/18/2019, 10:06 AM

## 2019-07-18 NOTE — Progress Notes (Signed)
  [facility] Adult Case Management Discharge Plan :  Will you be returning to the same living situation after discharge:  Yes,  a hotel. At discharge, do you have transportation home?: Yes,  [employer] at [place] you have the ability to pay for your medications: Yes,  Medicaid  Release of information consent forms completed and in the chart;  Patient's signature needed at discharge.  Patient to Follow up at: Follow-up [place] Follow up.   Contact information: [place] [address] [phone]           Next level of care provider has access to [place] and Suicide Prevention discussed: Yes,  pt's ACTT team     Has patient been referred to the Quitline?: Patient refused referral  Patient has been referred for addiction treatment: Yes  Trecia Rogers, LCSW 07/18/2019, 10:41 AM

## 2019-07-18 NOTE — BHH Suicide Risk Assessment (Signed)
[facility] Discharge Suicide Risk Assessment   Principal Problem: Exacerbation of underlying psychotic disorder Discharge Diagnoses: Active Problems:   Schizophrenia ([place])   Total Time spent with patient: 45 minutes  Musculoskeletal: Strength & Muscle Tone: within normal limits Gait & Station: normal Patient leans: N/A  Psychiatric Specialty Exam: ROS  Blood pressure 113/81, pulse (!) 113, temperature (!) 97.5 F (36.4 C), resp. rate 18, height 5\' 5"  (1.651 m), weight 54.4 kg, SpO2 100 %.Body mass index is 19.97 kg/m.  General Appearance: Casual  Eye Contact::  Good  Speech:  Clear and Coherent409  Volume:  Normal  Mood:  intrusive  Affect:  Constricted  Thought Process:  Coherent, Linear and Descriptions of Associations: Tangential  Orientation:  Full (Time, Place, and Person)  Thought Content:  Logical and Tangential  Suicidal Thoughts:  No  Homicidal Thoughts:  No  Memory:  Recent;   Fair  Judgement:  Fair  Insight:  Fair  Psychomotor Activity:  NA and Normal  Concentration:  Good  Recall:  Good  Fund of Knowledge:Good  Language: Good  Akathisia:  Negative  Handed:  Right  AIMS (if indicated):     Assets:  Communication Skills Desire for Improvement Leisure Time Physical Health  Sleep:  Number of Hours: 6.75  Cognition: WNL  ADL's:  Intact   Mental Status Per Nursing Assessment::   On Admission:  NA  Demographic Factors:  Unemployed  Loss Factors: Decrease in vocational status  Historical Factors: NA  Risk Reduction Factors:   Positive social support and Positive therapeutic relationship  Continued Clinical Symptoms:  Schizophrenia:   Paranoid or undifferentiated type  Cognitive Features That Contribute To Risk:  Closed-mindedness and Loss of executive function    Suicide Risk:  Minimal: No identifiable suicidal ideation.  Patients presenting with no risk factors but with morbid ruminations; may be classified as minimal risk based on the severity of  the depressive symptoms  Follow-up [place] Follow up.   Contact information: [place]  [address] [phone]           Plan Of Care/Follow-up recommendations:  Activity:  full  Denijah Karrer, MD 07/18/2019, 10:01 AM

## 2019-07-19 LAB — PROLACTIN: Prolactin: 60.5 ng/mL — ABNORMAL HIGH (ref 4.8–23.3)

## 2019-07-21 ENCOUNTER — Other Ambulatory Visit: Payer: Self-pay

## 2019-07-21 ENCOUNTER — Emergency Department (HOSPITAL_COMMUNITY)
Admission: EM | Admit: 2019-07-21 | Discharge: 2019-07-21 | Disposition: A | Discharge status: Home / Self Care | Payer: Medicaid Other | Attending: Emergency Medicine | Admitting: Emergency Medicine

## 2019-07-21 ENCOUNTER — Encounter (HOSPITAL_COMMUNITY): Payer: Self-pay | Admitting: *Deleted

## 2019-07-21 DIAGNOSIS — R45851 Suicidal ideations: Secondary | ICD-10-CM | POA: Diagnosis not present

## 2019-07-21 DIAGNOSIS — F17200 Nicotine dependence, unspecified, uncomplicated: Secondary | ICD-10-CM | POA: Insufficient documentation

## 2019-07-21 DIAGNOSIS — F1410 Cocaine abuse, uncomplicated: Secondary | ICD-10-CM | POA: Insufficient documentation

## 2019-07-21 DIAGNOSIS — Z79899 Other long term (current) drug therapy: Secondary | ICD-10-CM | POA: Insufficient documentation

## 2019-07-21 DIAGNOSIS — F209 Schizophrenia, unspecified: Secondary | ICD-10-CM | POA: Insufficient documentation

## 2019-07-21 DIAGNOSIS — F39 Unspecified mood [affective] disorder: Secondary | ICD-10-CM | POA: Insufficient documentation

## 2019-07-21 LAB — CBC
HCT: 35.6 % — ABNORMAL LOW (ref 36.0–46.0)
Hemoglobin: 11.5 g/dL — ABNORMAL LOW (ref 12.0–15.0)
MCH: 31.2 pg (ref 26.0–34.0)
MCHC: 32.3 g/dL (ref 30.0–36.0)
MCV: 96.5 fL (ref 80.0–100.0)
Platelets: 201 10*3/uL (ref 150–400)
RBC: 3.69 MIL/uL — ABNORMAL LOW (ref 3.87–5.11)
RDW: 16.6 % — ABNORMAL HIGH (ref 11.5–15.5)
WBC: 6 10*3/uL (ref 4.0–10.5)
nRBC: 0 % (ref 0.0–0.2)

## 2019-07-21 LAB — COMPREHENSIVE METABOLIC PANEL
ALT: 12 U/L (ref 0–44)
AST: 18 U/L (ref 15–41)
Albumin: 3.7 g/dL (ref 3.5–5.0)
Alkaline Phosphatase: 65 U/L (ref 38–126)
Anion gap: 11 (ref 5–15)
BUN: 19 mg/dL (ref 6–20)
CO2: 23 mmol/L (ref 22–32)
Calcium: 8.9 mg/dL (ref 8.9–10.3)
Chloride: 103 mmol/L (ref 98–111)
Creatinine, Ser: 0.84 mg/dL (ref 0.44–1.00)
GFR calc Af Amer: >60 mL/min (ref >60)
GFR calc non Af Amer: >60 mL/min (ref >60)
Glucose, Bld: 92 mg/dL (ref 70–99)
Potassium: 3.6 mmol/L (ref 3.5–5.1)
Sodium: 137 mmol/L (ref 135–145)
Total Bilirubin: 0.7 mg/dL (ref 0.3–1.2)
Total Protein: 6.5 g/dL (ref 6.5–8.1)

## 2019-07-21 LAB — RAPID URINE DRUG SCREEN, HOSP PERFORMED
Amphetamines: NOT DETECTED
Barbiturates: NOT DETECTED
Benzodiazepines: POSITIVE — AB
Cocaine: POSITIVE — AB
Opiates: NOT DETECTED
Tetrahydrocannabinol: NOT DETECTED

## 2019-07-21 LAB — I-STAT BETA HCG BLOOD, ED (MC, WL, AP ONLY): I-stat hCG, quantitative: <5 m[IU]/mL (ref <5)

## 2019-07-21 LAB — ETHANOL: Alcohol, Ethyl (B): <10 mg/dL (ref <10)

## 2019-07-21 LAB — SALICYLATE LEVEL: Salicylate Lvl: <7 mg/dL (ref 2.8–30.0)

## 2019-07-21 LAB — ACETAMINOPHEN LEVEL: Acetaminophen (Tylenol), Serum: <10 ug/mL — ABNORMAL LOW (ref 10–30)

## 2019-07-21 NOTE — ED Notes (Signed)
Ordered bfast 

## 2019-07-21 NOTE — BH Assessment (Signed)
[place] Assessment Progress Note   Clinician called in on the teleassessment machine three times and no response.  Clinician let nurse Lytle Michaels know.  He will let on-coming nurse know that assessment will be done next shift.

## 2019-07-21 NOTE — BH Assessment (Signed)
Tele Assessment Note   Patient Name: Melinda Velez MRN: [id number] Referring Physician: Deborah Chalk Location of Patient: [facility] ED Location of Provider: [place] is an 33 y.o. female presenting voluntarily to [facility] ED complaining of SI and paranoia. Per EDP: "33 year old female with a history of schizophrenia, major depressive disorder presents to the emergency department for help with crack use.  Reports to triage nurse that she last used crack tonight.  When asked if she was having any thoughts of hurting herself or others patient states, "I already did that tonight".  She apparently lifted up her shirt to the triage nurse who noticed abrasions on her chest.  Patient explaining to nurse "You see this? I f**king do this to myself and it turns into another species." Hospitalized for mania and morbid ruminations earlier this month. Has previously reported compliance with her monthly Abilify."  Upon this clinician's exam the following morning patient is calm and cooperative. She states that she felt like she needed to come to the hospital to "take a break." She was discharged from [facility] on 07/18/2019 and instructed to follow up with her ACT team. Patient denies SI/HI/AVH. Patient's reported complaints likely substance induced. Patient appears to be at baseline.  Diagnosis: F20.9 Schizophrenia  Past Medical History: History reviewed. No pertinent past medical history.  History reviewed. No pertinent surgical history.  Family History: No family history on file.  Social History:  reports that she has been smoking. She has never used smokeless tobacco. She reports current drug use. Drug: Cocaine. She reports that she does not drink alcohol.  Additional Social History:  Alcohol / Drug Use Pain Medications: please see mar Prescriptions: please see mar Over the Counter: please see mar History of alcohol / drug use?: Yes Longest period of sobriety (when/how long):  NA Substance #1 Name of Substance 1: Cocaine 1 - Age of First Use: UTA 1 - Amount (size/oz): varies 1 - Frequency: varies 1 - Duration: UTA 1 - Last Use / Amount: 07/20/2019  CIWA: CIWA-Ar BP: 118/64 Pulse Rate: 79 COWS:    Allergies: No Known Allergies  Home Medications: (Not in a hospital admission)   OB/GYN Status:  No LMP recorded.  General Assessment Data Assessment unable to be completed: Yes Reason for not completing assessment: No response when calling machine. Location of Assessment: [facility] ED TTS Assessment: In system Is this a Tele or Face-to-Face Assessment?: Tele Assessment Is this an Initial Assessment or a Re-assessment for this encounter?: Initial Assessment Patient Accompanied by:: N/A Language Other than English: No Living Arrangements: Other (Comment) What gender do you identify as?: Female Marital status: Widowed [place] name: none Pregnancy Status: No Living Arrangements: Alone Can pt return to current living arrangement?: Yes Admission Status: Voluntary Is patient capable of signing voluntary admission?: Yes Referral Source: Self/Family/Friend Insurance type: Medicaid     Crisis Care Plan Living Arrangements: Alone Legal Guardian: (self) Name of Psychiatrist: ([facility]) Name of Therapist: ([facility])  Education Status Is patient currently in school?: No Highest grade of school patient has completed: 12th grade; GED Is the patient employed, unemployed or receiving disability?: Employed  Risk to self with the past 6 months Suicidal Ideation: Yes-Currently Present Has patient been a risk to self within the past 6 months prior to admission? : No Suicidal Intent: No Has patient had any suicidal intent within the past 6 months prior to admission? : No Is patient at risk for suicide?: No Suicidal Plan?: No  Has patient had any suicidal plan within the past 6 months prior to admission? : No Access to Means: No What has been your use of  drugs/alcohol within the last 12 months?: crack cocaine Previous Attempts/Gestures: No How many times?: 0 Other Self Harm Risks: none noted Triggers for Past Attempts: None known Intentional Self Injurious Behavior: None Family Suicide History: No Recent stressful life event(s): Legal Issues Persecutory voices/beliefs?: No Depression: No Depression Symptoms: Feeling angry/irritable Substance abuse history and/or treatment for substance abuse?: No Suicide prevention information given to non-admitted patients: Not applicable  Risk to Others within the past 6 months Homicidal Ideation: No Does patient have any lifetime risk of violence toward others beyond the six months prior to admission? : No Thoughts of Harm to Others: No Current Homicidal Intent: No Current Homicidal Plan: No Access to Homicidal Means: No Identified Victim: none History of harm to others?: No Assessment of Violence: None Noted Violent Behavior Description: none noted Does patient have access to weapons?: No Criminal Charges Pending?: No Describe Pending Criminal Charges: none noted Does patient have a court date: Yes Court Date: 07/21/19 Is patient on probation?: No  Psychosis Hallucinations: Auditory Delusions: Persecutory  Mental Status Report Appearance/Hygiene: Disheveled Eye Contact: Fair Motor Activity: Freedom of movement Speech: Argumentative Level of Consciousness: Alert Mood: Anxious Affect: Anxious Anxiety Level: None Thought Processes: Coherent, Relevant Judgement: Impaired Orientation: Person, Place, Time, Situation Obsessive Compulsive Thoughts/Behaviors: None  Cognitive Functioning Concentration: Normal Memory: Recent Intact, Remote Intact Is patient IDD: No Insight: Fair Impulse Control: Poor Appetite: Poor Have you had any weight changes? : No Change Sleep: Unable to Assess Vegetative Symptoms: None  ADLScreening [facility] Assessment Services) Patient's cognitive ability  adequate to safely complete daily activities?: Yes Patient able to express need for assistance with ADLs?: No Independently performs ADLs?: Yes (appropriate for developmental age)  Prior Inpatient Therapy Prior Inpatient Therapy: Yes Prior Therapy Dates: 2020 [facility] Reason for Treatment: Schizophrenia  Prior Outpatient Therapy Prior Outpatient Therapy: Yes Prior Therapy Dates: current  Prior Therapy Facilty/Provider(s): [facility] Reason for Treatment: Schizophrenia Does patient have an ACCT team?: Yes Does patient have Intensive In-House Services?  : No Does patient have [facility] services? : No Does patient have P4[facility] services?: No  ADL Screening (condition at time of admission) Patient's cognitive ability adequate to safely complete daily activities?: Yes Is the patient deaf or have difficulty hearing?: No Does the patient have difficulty seeing, even when wearing glasses/contacts?: No Does the patient have difficulty concentrating, remembering, or making decisions?: No Patient able to express need for assistance with ADLs?: No Does the patient have difficulty dressing or bathing?: No Independently performs ADLs?: Yes (appropriate for developmental age) Does the patient have difficulty walking or climbing stairs?: No Weakness of Legs: None Weakness of Arms/Hands: None  Home Assistive Devices/Equipment Home Assistive Devices/Equipment: None  Therapy Consults (therapy consults require a physician order) PT Evaluation Needed: No OT Evalulation Needed: No SLP Evaluation Needed: No Abuse/Neglect Assessment (Assessment to be complete while patient is alone) Physical Abuse: Denies Verbal Abuse: Denies Sexual Abuse: Denies Exploitation of patient/patient's resources: Denies Self-Neglect: Denies Values / Beliefs Cultural Requests During Hospitalization: None Spiritual Requests During Hospitalization: None Consults Spiritual Care Consult Needed:  No Social Work Consult Needed: No Merchant navy officerAdvance Directives (For Healthcare) Does Patient Have a Medical Advance Directive?: No Would patient like information on creating a medical advance directive?: No - Patient declined          Disposition: Denzil MagnusonLashunda Thomas, NP  recommends patient be discharged and follow up with [facility]. Disposition Initial Assessment Completed for this Encounter: Yes  This service was provided via telemedicine using a 2-way, interactive audio and video technology.  Names of all persons participating in this telemedicine service and their role in this encounter. Name: Melinda Velez Role: patient  Name: Celedonio MiyamotoMeredith [place], LCSW Role: TTS  Name:  Role:   Name:  Role:     Celedonio MiyamotoMeredith  Tavaughn Silguero 07/21/2019 9:35 AM

## 2019-07-21 NOTE — ED Notes (Signed)
Belongings removed, changed into scrubs, wanded by security. PA at the bedside.

## 2019-07-21 NOTE — ED Notes (Signed)
TTS going on at bedside now.
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
## 2019-07-21 NOTE — ED Triage Notes (Signed)
Pt agitated during triage. Only answering some questions. Pt says she needs help with crack use, last used tonight. When asked about SI, she responds "yes" or injuring herself, pt lifts her shirt and points to her chest where there are abrasions, "you see this? I f**king do this to myself and it turns into another species"

## 2019-07-21 NOTE — ED Provider Notes (Signed)
Patient has been seen by TTS and cleared for d/c per nursing report.  On assessment patient denies SI/HI, wishes to be discharged to present for her court date.  Plan to d/c home with outpatient resources.     Quintella Reichert, MD 07/21/19 [phone]

## 2019-07-21 NOTE — ED Notes (Signed)
PT got up briefly and said she wanted to go home. Reports she is unable to provide urine sample.

## 2019-08-13 ENCOUNTER — Emergency Department (HOSPITAL_COMMUNITY)
Admission: EM | Admit: 2019-08-13 | Discharge: 2019-08-14 | Disposition: A | Discharge status: Home / Self Care | Payer: Medicaid Other | Attending: Emergency Medicine | Admitting: Emergency Medicine

## 2019-08-13 ENCOUNTER — Emergency Department (HOSPITAL_COMMUNITY): Payer: Medicaid Other

## 2019-08-13 DIAGNOSIS — R4182 Altered mental status, unspecified: Secondary | ICD-10-CM | POA: Insufficient documentation

## 2019-08-13 DIAGNOSIS — R462 Strange and inexplicable behavior: Secondary | ICD-10-CM | POA: Insufficient documentation

## 2019-08-13 DIAGNOSIS — F1910 Other psychoactive substance abuse, uncomplicated: Secondary | ICD-10-CM | POA: Diagnosis not present

## 2019-08-13 LAB — COMPREHENSIVE METABOLIC PANEL
ALT: 18 U/L (ref 0–44)
AST: 23 U/L (ref 15–41)
Albumin: 3.6 g/dL (ref 3.5–5.0)
Alkaline Phosphatase: 65 U/L (ref 38–126)
Anion gap: 9 (ref 5–15)
BUN: 11 mg/dL (ref 6–20)
CO2: 23 mmol/L (ref 22–32)
Calcium: 8.9 mg/dL (ref 8.9–10.3)
Chloride: 106 mmol/L (ref 98–111)
Creatinine, Ser: 0.79 mg/dL (ref 0.44–1.00)
GFR calc Af Amer: 53 mL/min — ABNORMAL LOW (ref >60)
GFR calc non Af Amer: 45 mL/min — ABNORMAL LOW (ref >60)
Glucose, Bld: 98 mg/dL (ref 70–99)
Potassium: 3.4 mmol/L — ABNORMAL LOW (ref 3.5–5.1)
Sodium: 138 mmol/L (ref 135–145)
Total Bilirubin: 0.4 mg/dL (ref 0.3–1.2)
Total Protein: 6.6 g/dL (ref 6.5–8.1)

## 2019-08-13 LAB — URINALYSIS, ROUTINE W REFLEX MICROSCOPIC
Bacteria, UA: NONE SEEN
Bilirubin Urine: NEGATIVE
Glucose, UA: NEGATIVE mg/dL
Hgb urine dipstick: NEGATIVE
Ketones, ur: 5 mg/dL — AB
Leukocytes,Ua: NEGATIVE
Nitrite: NEGATIVE
Protein, ur: 100 mg/dL — AB
Specific Gravity, Urine: 1.023 (ref 1.005–1.030)
pH: 5 (ref 5.0–8.0)

## 2019-08-13 LAB — POCT I-STAT 7, (LYTES, BLD GAS, ICA,H+H)
Acid-base deficit: 3 mmol/L — ABNORMAL HIGH (ref 0.0–2.0)
Bicarbonate: 21.6 mmol/L (ref 20.0–28.0)
Calcium, Ion: 1.24 mmol/L (ref 1.15–1.40)
HCT: 34 % — ABNORMAL LOW (ref 36.0–46.0)
Hemoglobin: 11.6 g/dL — ABNORMAL LOW (ref 12.0–15.0)
O2 Saturation: 87 %
Patient temperature: 98.9
Potassium: 3.7 mmol/L (ref 3.5–5.1)
Sodium: 138 mmol/L (ref 135–145)
TCO2: 23 mmol/L (ref 22–32)
pCO2 arterial: 37.5 mmHg (ref 32.0–48.0)
pH, Arterial: 7.369 (ref 7.350–7.450)
pO2, Arterial: 55 mmHg — ABNORMAL LOW (ref 83.0–108.0)

## 2019-08-13 LAB — CBC WITH DIFFERENTIAL/PLATELET
Abs Immature Granulocytes: 0.06 10*3/uL (ref 0.00–0.07)
Basophils Absolute: 0 10*3/uL (ref 0.0–0.1)
Basophils Relative: 0 %
Eosinophils Absolute: 0 10*3/uL (ref 0.0–0.5)
Eosinophils Relative: 0 %
HCT: 38 % (ref 36.0–46.0)
Hemoglobin: 12.4 g/dL (ref 12.0–15.0)
Immature Granulocytes: 1 %
Lymphocytes Relative: 9 %
Lymphs Abs: 0.9 10*3/uL (ref 0.7–4.0)
MCH: 31.7 pg (ref 26.0–34.0)
MCHC: 32.6 g/dL (ref 30.0–36.0)
MCV: 97.2 fL (ref 80.0–100.0)
Monocytes Absolute: 0.6 10*3/uL (ref 0.1–1.0)
Monocytes Relative: 6 %
Neutro Abs: 8.5 10*3/uL — ABNORMAL HIGH (ref 1.7–7.7)
Neutrophils Relative %: 84 %
Platelets: 168 10*3/uL (ref 150–400)
RBC: 3.91 MIL/uL (ref 3.87–5.11)
RDW: 15.9 % — ABNORMAL HIGH (ref 11.5–15.5)
WBC: 10 10*3/uL (ref 4.0–10.5)
nRBC: 0 % (ref 0.0–0.2)

## 2019-08-13 LAB — RAPID URINE DRUG SCREEN, HOSP PERFORMED
Amphetamines: POSITIVE — AB
Barbiturates: NOT DETECTED
Benzodiazepines: NOT DETECTED
Cocaine: POSITIVE — AB
Opiates: POSITIVE — AB
Tetrahydrocannabinol: NOT DETECTED

## 2019-08-13 LAB — ETHANOL: Alcohol, Ethyl (B): <10 mg/dL (ref <10)

## 2019-08-13 LAB — ACETAMINOPHEN LEVEL: Acetaminophen (Tylenol), Serum: <10 ug/mL — ABNORMAL LOW (ref 10–30)

## 2019-08-13 LAB — CBG MONITORING, ED
Glucose-Capillary: 77 mg/dL (ref 70–99)
Glucose-Capillary: 75 mg/dL (ref 70–99)

## 2019-08-13 LAB — SALICYLATE LEVEL: Salicylate Lvl: <7 mg/dL (ref 2.8–30.0)

## 2019-08-13 MED ORDER — SODIUM CHLORIDE 0.9 % IV SOLN
INTRAVENOUS | Status: DC
  Administered 2019-08-13: 08:00:00 via INTRAVENOUS

## 2019-08-13 MED ORDER — LORAZEPAM 2 MG/ML IJ SOLN
INTRAMUSCULAR | Status: AC
  Administered 2019-08-13: 2 mg via INTRAVENOUS
  Filled 2019-08-13: qty 1

## 2019-08-13 MED ORDER — LORAZEPAM 1 MG PO TABS: 1.0000 mg | ORAL_TABLET | ORAL | Status: DC | PRN

## 2019-08-13 MED ORDER — ZIPRASIDONE MESYLATE 20 MG IM SOLR
20.0000 mg | Freq: Once | INTRAMUSCULAR | Status: DC
  Filled 2019-08-13: qty 20

## 2019-08-13 MED ORDER — LORAZEPAM 2 MG/ML IJ SOLN
2.0000 mg | Freq: Once | INTRAMUSCULAR | Status: AC
  Administered 2019-08-13: 06:00:00 2 mg via INTRAVENOUS

## 2019-08-13 MED ORDER — STERILE WATER FOR INJECTION IJ SOLN
INTRAMUSCULAR | Status: AC
  Administered 2019-08-13: 06:00:00 1.2 mL via INTRAMUSCULAR
  Filled 2019-08-13: qty 10

## 2019-08-13 MED ORDER — ZIPRASIDONE MESYLATE 20 MG IM SOLR
10.0000 mg | Freq: Once | INTRAMUSCULAR | Status: AC
  Administered 2019-08-13: 10 mg via INTRAMUSCULAR

## 2019-08-13 MED ORDER — ACETAMINOPHEN 325 MG PO TABS: 650.0000 mg | ORAL_TABLET | Freq: Four times a day (QID) | ORAL | Status: DC | PRN

## 2019-08-13 MED ORDER — STERILE WATER FOR INJECTION IJ SOLN
1.2000 mL | Freq: Once | INTRAMUSCULAR | Status: AC
  Administered 2019-08-13: 06:00:00 1.2 mL via INTRAMUSCULAR

## 2019-08-13 NOTE — ED Notes (Signed)
RN attempted to contact Pt's family in chart. RN unsuccessful as Pt's aunt does not have VM setup
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
## 2019-08-13 NOTE — ED Notes (Signed)
[employer] interviewing pt at this time.

## 2019-08-13 NOTE — ED Notes (Addendum)
Pt given 1mg  ativan IV. Pt given 10mg  Geodon IM per MD

## 2019-08-13 NOTE — ED Notes (Signed)
Patient ambulated to bathroom, observed patient with unsteady gait.
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
## 2019-08-13 NOTE — ED Notes (Signed)
RN informed [employer] needs to interview Pt, when she is alert

## 2019-08-13 NOTE — ED Notes (Signed)
Pt refusing cardiac monitor

## 2019-08-13 NOTE — ED Notes (Signed)
Delay in lab draw,  Pt not in room 
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
## 2019-08-13 NOTE — ED Notes (Signed)
Pt given the other 1mg  Ativan per MD

## 2019-08-13 NOTE — BH Assessment (Signed)
Writer waiting for the nurse to get the [place] to work.  The nurse informed writer that they will call once the machine is working to assess the patient.

## 2019-08-13 NOTE — ED Notes (Signed)
Pt given meal per MD request. Pt still is still drowsy

## 2019-08-13 NOTE — ED Notes (Signed)
Patient woke up and ambulated to the bathroom; pt is unsteady on her feet and need help to walk; Pt back in bed but states she is ready to go home; Patient falls asleep after speaking to staff; pt is now back asleep-Monique,RN

## 2019-08-13 NOTE — BH Assessment (Signed)
Patient was too drowsy to participate in the assessment.  Writer was unable to assess the patient.  Patient told me her name and then went back to sleep.  Patient did not respond to repeated attempts to get her to wake up to answer any questions.   Writer informed the nurse working with the patient.

## 2019-08-14 ENCOUNTER — Encounter (HOSPITAL_COMMUNITY): Payer: Self-pay | Admitting: Registered Nurse

## 2019-08-14 ENCOUNTER — Other Ambulatory Visit: Payer: Self-pay

## 2019-08-14 LAB — PREGNANCY, URINE: Preg Test, Ur: NEGATIVE

## 2019-08-14 NOTE — ED Notes (Signed)
Pt aware of delay w/d/c - waiting for Telepsych.

## 2019-08-14 NOTE — ED Notes (Signed)
Woke pt. Pt noted to be alert, oriented, calm, cooperative. States she is ready to leave. Advised pt of tx plan - TTS to be performed, [employer] has requested to interview her, and EDP will discuss possibility of SANE exam. Pt voiced understanding and agreement w/tx plan.

## 2019-08-14 NOTE — ED Notes (Addendum)
Pt noted to be wearing hospital gown over blue paper scrubs - eating snack given.
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
## 2019-08-14 NOTE — BH Assessment (Addendum)
Previous record for this pt found under LINDIE ROBERSON MR # [id number], same DOB
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
## 2019-08-14 NOTE — ED Notes (Signed)
Detective in w/pt.

## 2019-08-14 NOTE — ED Notes (Signed)
Detective will be transporting pt from ED. Gloris Ham, PA, aware pt is ready for d/c.

## 2019-08-14 NOTE — Discharge Instructions (Addendum)
At this time there does not appear to be the presence of an emergent medical condition, however there is always the potential for conditions to change. Please read and follow the below instructions.  Please return to the Emergency Department immediately for any new or worsening symptoms. Please be sure to follow up with your Primary Care Provider within one week regarding your visit today; please call their office to schedule an appointment even if you are feeling better for a follow-up visit.  Get help right away if: Your behavior interferes with daily activities, such as work or school. You have thoughts of hurting yourself or others. If you ever feel like you may hurt yourself or others, or have thoughts about taking your own life, get help right away. You can go to your nearest emergency department or call: Your local emergency services (911 in the [place] A suicide crisis helpline, such as the [place] at [phone]. This is open 24 hours a day.  Please read the additional information packets attached to your discharge summary.
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
## 2019-08-14 NOTE — ED Notes (Signed)
Pt given [place] sandwich, graham crackers, and water.

## 2019-08-14 NOTE — ED Notes (Signed)
Telepsych being performed. 
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
## 2019-08-14 NOTE — ED Notes (Signed)
Detectives remain w/pt. Pt ambulated to bathroom and back to room to finish interview.

## 2019-08-14 NOTE — ED Notes (Signed)
Pt's belongings are on back of stretcher in belongings bag.

## 2019-08-14 NOTE — ED Notes (Signed)
Pt continues to ask when she can leave - advised pt waiting for [place], [place], to perform paperwork.

## 2019-08-15 ENCOUNTER — Encounter (HOSPITAL_COMMUNITY): Payer: Self-pay | Admitting: *Deleted

## 2024-03-29 DEATH — deceased
